# Patient Record
Sex: Female | Born: 1975 | Race: Black or African American | Hispanic: No | Marital: Married | Smoking: Current every day smoker
Health system: Southern US, Community
[De-identification: ages and names within clinical notes are randomized; demographics above are authoritative.]

## PROBLEM LIST (undated history)

## (undated) ENCOUNTER — Inpatient Hospital Stay (HOSPITAL_COMMUNITY): Payer: Self-pay

## (undated) ENCOUNTER — Ambulatory Visit (HOSPITAL_COMMUNITY): Discharge status: Absent without leave | Payer: No Typology Code available for payment source

## (undated) DIAGNOSIS — Z789 Other specified health status: Secondary | ICD-10-CM

## (undated) DIAGNOSIS — B1910 Unspecified viral hepatitis B without hepatic coma: Secondary | ICD-10-CM

## (undated) HISTORY — PX: WISDOM TOOTH EXTRACTION: SHX21

## (undated) HISTORY — PX: LAPAROSCOPY: SHX197

---

## 1998-08-01 ENCOUNTER — Emergency Department (HOSPITAL_COMMUNITY): Admission: EM | Admit: 1998-08-01 | Discharge: 1998-08-01 | Payer: Self-pay | Admitting: *Deleted

## 2000-11-25 ENCOUNTER — Encounter: Payer: Self-pay | Admitting: Emergency Medicine

## 2000-11-25 ENCOUNTER — Emergency Department (HOSPITAL_COMMUNITY): Admission: EM | Admit: 2000-11-25 | Discharge: 2000-11-25 | Payer: Self-pay | Admitting: Emergency Medicine

## 2001-01-17 ENCOUNTER — Emergency Department (HOSPITAL_COMMUNITY): Admission: EM | Admit: 2001-01-17 | Discharge: 2001-01-17 | Payer: Self-pay | Admitting: Emergency Medicine

## 2001-01-17 ENCOUNTER — Encounter: Payer: Self-pay | Admitting: Emergency Medicine

## 2001-05-20 ENCOUNTER — Emergency Department (HOSPITAL_COMMUNITY): Admission: EM | Admit: 2001-05-20 | Discharge: 2001-05-20 | Payer: Self-pay | Admitting: Emergency Medicine

## 2001-05-20 ENCOUNTER — Encounter: Payer: Self-pay | Admitting: Emergency Medicine

## 2001-09-15 ENCOUNTER — Emergency Department (HOSPITAL_COMMUNITY): Admission: EM | Admit: 2001-09-15 | Discharge: 2001-09-15 | Payer: Self-pay | Admitting: Emergency Medicine

## 2001-09-15 ENCOUNTER — Encounter: Payer: Self-pay | Admitting: Emergency Medicine

## 2002-10-10 ENCOUNTER — Encounter: Payer: Self-pay | Admitting: Emergency Medicine

## 2002-10-10 ENCOUNTER — Emergency Department (HOSPITAL_COMMUNITY): Admission: EM | Admit: 2002-10-10 | Discharge: 2002-10-10 | Payer: Self-pay | Admitting: Emergency Medicine

## 2002-12-05 ENCOUNTER — Encounter: Payer: Self-pay | Admitting: *Deleted

## 2002-12-06 ENCOUNTER — Inpatient Hospital Stay (HOSPITAL_COMMUNITY): Admission: EM | Admit: 2002-12-06 | Discharge: 2002-12-08 | Payer: Self-pay | Admitting: Emergency Medicine

## 2003-02-02 ENCOUNTER — Encounter: Admission: RE | Admit: 2003-02-02 | Discharge: 2003-02-02 | Payer: Self-pay | Admitting: Internal Medicine

## 2003-08-07 ENCOUNTER — Inpatient Hospital Stay (HOSPITAL_COMMUNITY): Admission: AD | Admit: 2003-08-07 | Discharge: 2003-08-07 | Payer: Self-pay | Admitting: *Deleted

## 2003-08-15 ENCOUNTER — Emergency Department (HOSPITAL_COMMUNITY): Admission: EM | Admit: 2003-08-15 | Discharge: 2003-08-15 | Payer: Self-pay

## 2003-08-27 ENCOUNTER — Inpatient Hospital Stay (HOSPITAL_COMMUNITY): Admission: AD | Admit: 2003-08-27 | Discharge: 2003-08-27 | Payer: Self-pay | Admitting: Gynecology

## 2004-01-06 ENCOUNTER — Ambulatory Visit: Payer: Self-pay | Admitting: Family Medicine

## 2004-01-06 ENCOUNTER — Inpatient Hospital Stay (HOSPITAL_COMMUNITY): Admission: AD | Admit: 2004-01-06 | Discharge: 2004-01-06 | Payer: Self-pay | Admitting: *Deleted

## 2004-01-09 ENCOUNTER — Inpatient Hospital Stay (HOSPITAL_COMMUNITY): Admission: AD | Admit: 2004-01-09 | Discharge: 2004-01-09 | Payer: Self-pay | Admitting: Obstetrics and Gynecology

## 2004-02-01 ENCOUNTER — Inpatient Hospital Stay (HOSPITAL_COMMUNITY): Admission: AD | Admit: 2004-02-01 | Discharge: 2004-02-01 | Payer: Self-pay | Admitting: Obstetrics & Gynecology

## 2004-02-08 ENCOUNTER — Ambulatory Visit (HOSPITAL_COMMUNITY): Admission: RE | Admit: 2004-02-08 | Discharge: 2004-02-08 | Payer: Self-pay | Admitting: *Deleted

## 2004-04-01 ENCOUNTER — Inpatient Hospital Stay: Admission: AD | Admit: 2004-04-01 | Discharge: 2004-04-01 | Payer: Self-pay | Admitting: *Deleted

## 2004-04-03 ENCOUNTER — Inpatient Hospital Stay (HOSPITAL_COMMUNITY): Admission: AD | Admit: 2004-04-03 | Discharge: 2004-04-03 | Payer: Self-pay | Admitting: *Deleted

## 2004-04-16 ENCOUNTER — Inpatient Hospital Stay (HOSPITAL_COMMUNITY): Admission: AD | Admit: 2004-04-16 | Discharge: 2004-04-19 | Payer: Self-pay | Admitting: Family Medicine

## 2004-04-16 ENCOUNTER — Ambulatory Visit: Payer: Self-pay | Admitting: Family Medicine

## 2008-07-15 ENCOUNTER — Emergency Department (HOSPITAL_COMMUNITY): Admission: EM | Admit: 2008-07-15 | Discharge: 2008-07-15 | Payer: Self-pay | Admitting: Emergency Medicine

## 2010-05-30 NOTE — H&P (Signed)
NAME:  Alice Hernandez, Alice Hernandez NO.:  [id number]   MEDICAL RECORD NO.:  [id number]                   PATIENT TYPE:  INP   LOCATION:  0106                                 FACILITY:  [facility]   PHYSICIAN:  Hollice Espy, M.D.            DATE OF BIRTH:  1975-07-28   DATE OF ADMISSION:  12/06/2002  DATE OF DISCHARGE:                                HISTORY & PHYSICAL   HISTORY OF PRESENT ILLNESS:  This is a 35 year old African-American female  with past medical history of a laparoscopic ovarian cystectomy, who for the  past five to seven days has had increased abdominal pain, mostly located in  the right lower quadrant radiating to the right flank.  She has had  secondary nausea, fevers, and chills.  She denies any hematuria or dysuria.  The pain became so severe that she went to [facility] on December 05, 2002, evening.  There she had a UA which showed moderate bacteria, a few  clue cells, but no white cells in the urine were seen.  She also had a white  count of 14.1 with a 93% shift.  Her urine pregnancy was negative.  She was  also noted to have urinary protein.  She ended up having a gynecologic  ultrasound which was normal, showing normal uterus and ovaries and no free  fluid.  On the report it was noted that she had scant thick white vaginal  discharge that had a positive odor.  There was concern that a definitive  diagnosis was not yet made and she may have appendicitis, so she was  transferred to [facility] for a CT.  The CT showed no evidence of  appendicitis but did show perinephric stranding on her right kidney  consistent with pyelonephritis.  Surgery, Adolph Pollack, M.D., was  consulted to correlate this, and he indeed felt that this was not  appendicitis and this was likely pyelonephritis.  The patient was given  morphine for pain, which did help, but she remains severely tender.  Her  last menstrual period was six days ago.  Currently  she is sleeping and says  that she is not having any nausea or fevers but she did complain of some  mild pain still.  Otherwise, she does not give much of a history as she is  very tired.   PAST MEDICAL HISTORY:  Laparoscopic ovarian cystectomy.   MEDICATIONS:  She is not on any chronic medications.  She tried taking a  Vicodin for pain prior to coming in on the 23rd, but this did not help.  She  is also on birth control pills.   ALLERGIES:  She has no known drug allergies.   SOCIAL HISTORY:  She is a half pack a day smoker for the last few years.  She does drink alcohol occasionally.  She denies any drug use.   FAMILY HISTORY:  Noncontributory.  PHYSICAL EXAMINATION:  VITAL SIGNS:  Her vitals on admission were  temperature 102.8, pulse 78, respirations 24, blood pressure 130/69.  GENERAL:  She is currently in no apparent distress prior to examination.  She is lethargic but arousable.  She is oriented x3.  HEENT:  Normocephalic, atraumatic.  Her mucous membranes are dry.  NECK:  No carotid bruits.  CARDIAC:  Regular rate and rhythm.  CHEST:  Lungs clear to auscultation bilaterally.  ABDOMEN:  Extremely tender throughout, especially in the right lower  quadrant but also in the right upper quadrant as well, with minimal  palpation, and light contact causes severe pain.  She has hypoactive bowel  sounds.  It is nondistended.  Her right flank is even more exquisitely  tender.  Her left flank is not as tender; however, causing secondary  radiation does make it tender.  EXTREMITIES:  No clubbing, cyanosis, or edema.   LABORATORY DATA:  Lab studies are all from [facility].  Urine showed  few clue cells, no white cells, no yeast, and no Trichomonas.  Urine  pregnancy was negative.  White count 14.1, H&H 11.7 and 34.0, MCV of 89,  platelet count 149.  Three to six red cells seen in the UA, few bacteria on  the second report.  Moderate amount of hemoglobin, 100 protein, 15  ketones.  Ultrasound again showed no evidence of any abnormalities.   ASSESSMENT AND PLAN:  This is a 35 year old African-American female with  what looks to be pyelonephritis despite a nonimpressive urinalysis but with  CT and physical exam findings.  Would give her pain and nausea control, IV  Cipro, check blood cultures.  Should consider a renal ultrasound or a  cystoscopy if she is not improving.  Note that she has severe abdominal pain  which is very tender, which again may be radiation from her flank, but we  will still follow up on the final read of the CT.  Will check LFTs and  lipase for thoroughness.  May consider Flagyl for Trichomonas if she is not  getting any better.  She also had reported white vaginal discharge.  Will  give a dose of Diflucan for this.                                               Hollice Espy, M.D.    SKK/MEDQ  D:  12/06/2002  T:  12/06/2002  Job:  [id number]

## 2010-05-30 NOTE — H&P (Signed)
NAMEPATRA, Hernandez NO.:  [id number]   MEDICAL RECORD NO.:  [id number]                   PATIENT TYPE:  INP   LOCATION:  0106                                 FACILITY:  [facility]   PHYSICIAN:  Adolph Pollack, M.D.            DATE OF BIRTH:  [birth year]   DATE OF ADMISSION:  12/06/2002  DATE OF DISCHARGE:                                HISTORY & PHYSICAL   REQUESTING PHYSICIAN:  Carleene Cooper, M.D.   REASON FOR ADMISSION:  Right lower quadrant, possible appendicitis.   HISTORY OF PRESENT ILLNESS:  Ms. Alice Hernandez is a 35 year old female who had the  onset of right-sided abdominal pain that felt like a cramping about a week  ago.  It progressively worsened and began radiating to the right lower  quadrant and right flank region.  She had some anorexia and hard fevers and  chills.  No vomiting present.  She denied any dysuria or vaginal discharge.  Also reported having a little bit of diarrhea but the last BM was about four  to five days ago.  She has not eaten much or had much to drink.   PAST MEDICAL HISTORY:  Ovarian cyst.   PAST SURGICAL HISTORY:  Laparoscopic ovarian cystectomy.   ALLERGIES:  None.   MEDICATIONS:  1. Tylenol p.r.n.  2. BC powders p.r.n.  3. Birth control pill.   SOCIAL HISTORY:  Smokes a half pack of cigarettes a day.  Occasional  alcohol.  She is single and employed.   FAMILY HISTORY:  No chronic illnesses in family.   REVIEW OF SYSTEMS:  CARDIOVASCULAR:  No hypertension, coronary artery  disease.  PULMONARY:  No asthma, pneumonia.  GASTROINTESTINAL:  No history  of peptic ulcer disease, hepatitis, colitis, or diverticulitis.  GENITOURINARY:  Denies any kidney stones.  ENDOCRINE:  No diabetes or  thyroid disease.  NEUROLOGIC:  No seizures.  HEMATOLOGIC:  No bleeding  disorders, deep venous thrombosis.   PHYSICAL EXAMINATION:  GENERAL:  Ill-appearing female.  VITAL SIGNS:  Maximum temperature in the emergency department  103.8 with  blood pressure 135/83 and a pulse of 111.  HEENT:  Eyes:  Extraocular movements are intact.  No icterus.  NECK:  Without palpable masses or obvious thyroid enlargement.  RESPIRATORY:  Breath sounds equal and clear.  Respirations unlabored.  CARDIOVASCULAR:  Increased rate with a regular rhythm.  No JVD and no lower  extremity edema.  ABDOMEN:  Slightly firm, distended with hypoactive bowel sounds.  She has  right lower quadrant and right upper quadrant tenderness to palpation and  some to percussion.  She has severe right flank tenderness to palpation.  No  palpable masses or organomegaly noted.  EXTREMITIES:  Good muscle tone and full range of motion.   LABORATORY DATA:  White blood cell count 14,100, platelet count 149,000,  hemoglobin 11.  UA showed 3-6 white blood cells, some rare bacteria.   CT  scan demonstrates right kidney edema and perinephric inflammatory  changes.  Appendix is normal.   IMPRESSION:  Acute right pyelonephritis.  Appendix appears normal at this  time.  No significant free fluid noted.   RECOMMENDATIONS:  I recommend medical admission with intravenous fluid  hydration, intravenous antibiotics.                                               Adolph Pollack, M.D.    Kari Baars  D:  12/06/2002  T:  12/06/2002  Job:  [id number]

## 2010-05-30 NOTE — Discharge Summary (Signed)
NAMEBETSEY, SOSSAMON                           ACCOUNT NO.:  [id number]   MEDICAL RECORD NO.:  [id number]                   PATIENT TYPE:  INP   LOCATION:  0355                                 FACILITY:  [facility]   PHYSICIAN:  Corinna L. Lendell Caprice, MD             DATE OF BIRTH:  [birth year]   DATE OF ADMISSION:  12/06/2002  DATE OF DISCHARGE:  12/08/2002                                 DISCHARGE SUMMARY   DIAGNOSES:  1. Pyelonephritis.  2. Bacterial vaginitis.  3. Candidal vaginitis.  4. Hypokalemia.  5. Tobacco abuse, counseled against.   DISCHARGE MEDICATIONS:  1. Ciprofloxacin 500 mg p.o. b.i.d. for four more days.  2. Flagyl 500 mg p.o. b.i.d. for seven days.  3. Phenergan as needed.  4. Percocet as needed; 15 were dispensed.   DIET:  As tolerated.  No alcohol while taking Flagyl.   FOLLOW UP:  Dr. Dorothyann Peng in 2-4 weeks.   ACTIVITY:  Ad lib.   CONDITION AT DISCHARGE:  Stable.   HISTORY AND HOSPITAL COURSE:  Very briefly, Ms. Cuthrell is a 35 year old  black female that presented to the [facility] with flank pain.  She  has a history of laparoscopic ovarian cystectomy.  She had right lower  quadrant tenderness and right flank tenderness and apparently had a lot of  guarding and there were concerns over appendicitis.  Therefore, she was  transferred to [facility] and seen by Dr. Abbey Chatters.  A CAT scan  also was done which showed no appendicitis but a lot of right-sided  perinephric stranding consistent with pyelonephritis.  Dr. Maris Berger  clinical exam also was against the possibility of appendicitis.  The patient  had blood cultures which are at this time pending.  She had a UA which was  not terribly remarkable but did show clue cells and she apparently had a lot  of white vaginal discharge.  She received IV ciprofloxacin, IV fluids, pain  medications, antiemetics, and antipyretics.  She also received a dose of  Diflucan.  Her fever improved, as did  her pain and vomiting.  At the time of  discharge, she was very anxious to go home and was ambulating outside.  She  should follow up with Dr. Allyne Gee in 2-4 weeks.                                               Corinna L. Lendell Caprice, MD    CLS/MEDQ  D:  12/08/2002  T:  12/08/2002  Job:  [id number]   cc:   Candyce Churn. Allyne Gee, M.D.  [address]  [address]  [place]  [place] [address]  Fax: [phone]

## 2011-01-13 NOTE — L&D Delivery Note (Signed)
Delivery Note  Alice Hernandez is a 35yo G4 now P2 at 37 weeks and 3 days (by date of conception, unconfirmed) who came in with [facility].  She had very late prenatal care (moved here from [facility]) and insufficient care even before moving.  She had two UDS that were positive for illegal substances, one for cocaine and the other for marijuana and opiates.  At 2:38 AM a viable female was delivered via Vaginal, Spontaneous Delivery (Presentation: Right Occiput Anterior).  APGAR: 9, 9; weight 5 lb 9.4 oz (2535 g) - he is AGA .   Placenta status: Intact, Spontaneous.  Cord: 3 vessels.  Anesthesia: Epidural  Episiotomy: None Lacerations: 1st degree Est. Blood Loss (mL): 350  Mom to postpartum.  Baby to nursery-stable.  Baby is a little jittery and will have a meconium drug screen.  Alice Hernandez, CNM, was present for the delivery.  Junious Silk S 07/22/2011, 4:01 AM  Pt had initially signed BTL papers, but has changed her mind and wants a Nexplanon now.  We pulled the epidural catheter after delivery in light of that.

## 2011-01-13 NOTE — L&D Delivery Note (Signed)
Attestation of Attending Supervision of Resident: Evaluation and management procedures were performed by the [facility] Medicine Resident under my supervision.  I have seen and examined the patient, reviewed the resident's note and chart, and I agree with the management and plan.  Anibal Henderson, M.D. 08/01/2011 2:57 PM

## 2011-07-05 ENCOUNTER — Encounter (HOSPITAL_COMMUNITY): Payer: Self-pay | Admitting: *Deleted

## 2011-07-05 ENCOUNTER — Inpatient Hospital Stay (HOSPITAL_COMMUNITY)
Admission: AD | Admit: 2011-07-05 | Discharge: 2011-07-06 | Disposition: A | Discharge status: Hospice | Payer: Medicaid Other | Source: Ambulatory Visit | Attending: Obstetrics & Gynecology | Admitting: Obstetrics & Gynecology

## 2011-07-05 DIAGNOSIS — R109 Unspecified abdominal pain: Secondary | ICD-10-CM | POA: Insufficient documentation

## 2011-07-05 DIAGNOSIS — A499 Bacterial infection, unspecified: Secondary | ICD-10-CM | POA: Insufficient documentation

## 2011-07-05 DIAGNOSIS — N760 Acute vaginitis: Secondary | ICD-10-CM | POA: Insufficient documentation

## 2011-07-05 DIAGNOSIS — O4700 False labor before 37 completed weeks of gestation, unspecified trimester: Secondary | ICD-10-CM | POA: Insufficient documentation

## 2011-07-05 DIAGNOSIS — B9689 Other specified bacterial agents as the cause of diseases classified elsewhere: Secondary | ICD-10-CM | POA: Insufficient documentation

## 2011-07-05 DIAGNOSIS — O2390 Unspecified genitourinary tract infection in pregnancy, unspecified trimester: Secondary | ICD-10-CM | POA: Insufficient documentation

## 2011-07-05 HISTORY — DX: Other specified health status: Z78.9

## 2011-07-05 LAB — DIFFERENTIAL
Basophils Absolute: 0 10*3/uL (ref 0.0–0.1)
Basophils Relative: 1 % (ref 0–1)
Eosinophils Absolute: 0.2 10*3/uL (ref 0.0–0.7)
Eosinophils Relative: 2 % (ref 0–5)
Lymphocytes Relative: 34 % (ref 12–46)

## 2011-07-05 LAB — OB RESULTS CONSOLE GBS: GBS: NEGATIVE

## 2011-07-05 LAB — URINALYSIS, ROUTINE W REFLEX MICROSCOPIC
Ketones, ur: NEGATIVE mg/dL
Nitrite: NEGATIVE
Specific Gravity, Urine: <1.005 — ABNORMAL LOW (ref 1.005–1.030)
pH: 7 (ref 5.0–8.0)

## 2011-07-05 LAB — CBC
MCV: 86 fL (ref 78.0–100.0)
Platelets: 195 10*3/uL (ref 150–400)
RDW: 14 % (ref 11.5–15.5)
WBC: 8.1 10*3/uL (ref 4.0–10.5)

## 2011-07-05 LAB — OB RESULTS CONSOLE GC/CHLAMYDIA
Chlamydia: NEGATIVE
Gonorrhea: NEGATIVE

## 2011-07-05 LAB — URINE MICROSCOPIC-ADD ON

## 2011-07-05 NOTE — MAU Note (Signed)
Stated earlier that she has been homeless and has not been having regular meals; states now that she just moved in with a girlfriend today; does not have custody of her child;  Upset, crying and seems slightly irrational; states that she "doesn't know anything about this baby and has nothing for this baby"; no prenatal care' has lived in [place] for past 4 months; admits to smoking tobacco and marijuana; denies any other drug use; pt's family lives in [place] and pt states that she does not want to go back to [place]; asking for an ultrasound "to make sure the baby is okay";

## 2011-07-05 NOTE — MAU Note (Signed)
Brought in by EMS; c/o ucs for past hour; c/o being homeless and having no prenatal care;

## 2011-07-06 DIAGNOSIS — N760 Acute vaginitis: Secondary | ICD-10-CM

## 2011-07-06 DIAGNOSIS — A499 Bacterial infection, unspecified: Secondary | ICD-10-CM

## 2011-07-06 LAB — WET PREP, GENITAL: Yeast Wet Prep HPF POC: NONE SEEN

## 2011-07-06 LAB — RAPID URINE DRUG SCREEN, HOSP PERFORMED
Barbiturates: NOT DETECTED
Benzodiazepines: NOT DETECTED

## 2011-07-06 MED ORDER — PRENATAL 27-0.8 MG PO TABS: 1.0000 | ORAL_TABLET | Freq: Every day | ORAL | Status: DC

## 2011-07-06 MED ORDER — METRONIDAZOLE 500 MG PO TABS: 500.0000 mg | ORAL_TABLET | Freq: Two times a day (BID) | ORAL | Status: AC

## 2011-07-06 NOTE — Discharge Instructions (Signed)
Bacterial Vaginosis Bacterial vaginosis (BV) is a vaginal infection where the normal balance of bacteria in the vagina is disrupted. The normal balance is then replaced by an overgrowth of certain bacteria. There are several different kinds of bacteria that can cause BV. BV is the most common vaginal infection in women of childbearing age. CAUSES   The cause of BV is not fully understood. BV develops when there is an increase or imbalance of harmful bacteria.   Some activities or behaviors can upset the normal balance of bacteria in the vagina and put women at increased risk including:   Having a new sex partner or multiple sex partners.   Douching.   Using an intrauterine device (IUD) for contraception.   It is not clear what role sexual activity plays in the development of BV. However, women that have never had sexual intercourse are rarely infected with BV.  Women do not get BV from toilet seats, bedding, swimming pools or from touching objects around them.  SYMPTOMS   Grey vaginal discharge.   A fish-like odor with discharge, especially after sexual intercourse.   Itching or burning of the vagina and vulva.   Burning or pain with urination.   Some women have no signs or symptoms at all.  DIAGNOSIS  Your caregiver must examine the vagina for signs of BV. Your caregiver will perform lab tests and look at the sample of vaginal fluid through a microscope. They will look for bacteria and abnormal cells (clue cells), a pH test higher than 4.5, and a positive amine test all associated with BV.  RISKS AND COMPLICATIONS   Pelvic inflammatory disease (PID).   Infections following gynecology surgery.   Developing HIV.   Developing herpes virus.  TREATMENT  Sometimes BV will clear up without treatment. However, all women with symptoms of BV should be treated to avoid complications, especially if gynecology surgery is planned. Female partners generally do not need to be treated.  However, BV may spread between female sex partners so treatment is helpful in preventing a recurrence of BV.   BV may be treated with antibiotics. The antibiotics come in either pill or vaginal cream forms. Either can be used with nonpregnant or pregnant women, but the recommended dosages differ. These antibiotics are not harmful to the baby.   BV can recur after treatment. If this happens, a second round of antibiotics will often be prescribed.   Treatment is important for pregnant women. If not treated, BV can cause a premature delivery, especially for a pregnant woman who had a premature birth in the past. All pregnant women who have symptoms of BV should be checked and treated.   For chronic reoccurrence of BV, treatment with a type of prescribed gel vaginally twice a week is helpful.  HOME CARE INSTRUCTIONS   Finish all medication as directed by your caregiver.   Do not have sex until treatment is completed.   Tell your sexual partner that you have a vaginal infection. They should see their caregiver and be treated if they have problems, such as a mild rash or itching.   Practice safe sex. Use condoms. Only have 1 sex partner.  PREVENTION  Basic prevention steps can help reduce the risk of upsetting the natural balance of bacteria in the vagina and developing BV:  Do not have sexual intercourse (be abstinent).   Do not douche.   Use all of the medicine prescribed for treatment of BV, even if the signs and symptoms go away.  Tell your sex partner if you have BV. That way, they can be treated, if needed, to prevent reoccurrence.  SEEK MEDICAL CARE IF:   Your symptoms are not improving after 3 days of treatment.   You have increased discharge, pain, or fever.  MAKE SURE YOU:   Understand these instructions.   Will watch your condition.   Will get help right away if you are not doing well or get worse.  FOR MORE INFORMATION  Division of STD Prevention (DSTDP), Centers for  Disease Control and Prevention: [employer] American Social Health Association (ASHA): www.ashastd.org  Document Released: 12/29/2004 Document Revised: 12/18/2010 Document Reviewed: 06/21/2008 [facility] Patient Information 2012 [place], [place].Bacterial Vaginosis Bacterial vaginosis (BV) is a vaginal infection where the normal balance of bacteria in the vagina is disrupted. The normal balance is then replaced by an overgrowth of certain bacteria. There are several different kinds of bacteria that can cause BV. BV is the most common vaginal infection in women of childbearing age. CAUSES   The cause of BV is not fully understood. BV develops when there is an increase or imbalance of harmful bacteria.   Some activities or behaviors can upset the normal balance of bacteria in the vagina and put women at increased risk including:   Having a new sex partner or multiple sex partners.   Douching.   Using an intrauterine device (IUD) for contraception.   It is not clear what role sexual activity plays in the development of BV. However, women that have never had sexual intercourse are rarely infected with BV.  Women do not get BV from toilet seats, bedding, swimming pools or from touching objects around them.  SYMPTOMS   Grey vaginal discharge.   A fish-like odor with discharge, especially after sexual intercourse.   Itching or burning of the vagina and vulva.   Burning or pain with urination.   Some women have no signs or symptoms at all.  DIAGNOSIS  Your caregiver must examine the vagina for signs of BV. Your caregiver will perform lab tests and look at the sample of vaginal fluid through a microscope. They will look for bacteria and abnormal cells (clue cells), a pH test higher than 4.5, and a positive amine test all associated with BV.  RISKS AND COMPLICATIONS   Pelvic inflammatory disease (PID).   Infections following gynecology surgery.   Developing HIV.   Developing herpes  virus.  TREATMENT  Sometimes BV will clear up without treatment. However, all women with symptoms of BV should be treated to avoid complications, especially if gynecology surgery is planned. Female partners generally do not need to be treated. However, BV may spread between female sex partners so treatment is helpful in preventing a recurrence of BV.   BV may be treated with antibiotics. The antibiotics come in either pill or vaginal cream forms. Either can be used with nonpregnant or pregnant women, but the recommended dosages differ. These antibiotics are not harmful to the baby.   BV can recur after treatment. If this happens, a second round of antibiotics will often be prescribed.   Treatment is important for pregnant women. If not treated, BV can cause a premature delivery, especially for a pregnant woman who had a premature birth in the past. All pregnant women who have symptoms of BV should be checked and treated.   For chronic reoccurrence of BV, treatment with a type of prescribed gel vaginally twice a week is helpful.  HOME CARE INSTRUCTIONS   Finish all medication  as directed by your caregiver.   Do not have sex until treatment is completed.   Tell your sexual partner that you have a vaginal infection. They should see their caregiver and be treated if they have problems, such as a mild rash or itching.   Practice safe sex. Use condoms. Only have 1 sex partner.  PREVENTION  Basic prevention steps can help reduce the risk of upsetting the natural balance of bacteria in the vagina and developing BV:  Do not have sexual intercourse (be abstinent).   Do not douche.   Use all of the medicine prescribed for treatment of BV, even if the signs and symptoms go away.   Tell your sex partner if you have BV. That way, they can be treated, if needed, to prevent reoccurrence.  SEEK MEDICAL CARE IF:   Your symptoms are not improving after 3 days of treatment.   You have increased  discharge, pain, or fever.  MAKE SURE YOU:   Understand these instructions.   Will watch your condition.   Will get help right away if you are not doing well or get worse.  FOR MORE INFORMATION  Division of STD Prevention (DSTDP), Centers for Disease Control and Prevention: [employer]Apps[employer] American Social Health Association (ASHA): www.ashastd.org  Document Released: 12/29/2004 Document Revised: 12/18/2010 Document Reviewed: 06/21/2008 [facility] Patient Information 2012 [place], [place].  Braxton Hicks Contractions Pregnancy is commonly associated with contractions of the uterus throughout the pregnancy. Towards the end of pregnancy (32 to 34 weeks), these contractions [facility] Rough) can develop more often and may become more forceful. This is not true labor because these contractions do not result in opening (dilatation) and thinning of the cervix. They are sometimes difficult to tell apart from true labor because these contractions can be forceful and people have different pain tolerances. You should not feel embarrassed if you go to the hospital with false labor. Sometimes, the only way to tell if you are in true labor is for your caregiver to follow the changes in the cervix. How to tell the difference between true and false labor:  False labor.   The contractions of false labor are usually shorter, irregular and not as hard as those of true labor.   They are often felt in the front of the lower abdomen and in the groin.   They may leave with walking around or changing positions while lying down.   They get weaker and are shorter lasting as time goes on.   These contractions are usually irregular.   They do not usually become progressively stronger, regular and closer together as with true labor.   True labor.   Contractions in true labor last 30 to 70 seconds, become very regular, usually become more intense, and increase in frequency.   They do not go away with walking.    The discomfort is usually felt in the top of the uterus and spreads to the lower abdomen and low back.   True labor can be determined by your caregiver with an exam. This will show that the cervix is dilating and getting thinner.  If there are no prenatal problems or other health problems associated with the pregnancy, it is completely safe to be sent home with false labor and await the onset of true labor. HOME CARE INSTRUCTIONS   Keep up with your usual exercises and instructions.   Take medications as directed.   Keep your regular prenatal appointment.   Eat and drink lightly if you think  you are going into labor.   If BH contractions are making you uncomfortable:   Change your activity position from lying down or resting to walking/walking to resting.   Sit and rest in a tub of warm water.   Drink 2 to 3 glasses of water. Dehydration may cause B-H contractions.   Do slow and deep breathing several times an hour.  SEEK IMMEDIATE MEDICAL CARE IF:   Your contractions continue to become stronger, more regular, and closer together.   You have a gushing, burst or leaking of fluid from the vagina.   An oral temperature above 102 F (38.9 C) develops.   You have passage of blood-tinged mucus.   You develop vaginal bleeding.   You develop continuous belly (abdominal) pain.   You have low back pain that you never had before.   You feel the baby's head pushing down causing pelvic pressure.   The baby is not moving as much as it used to.  Document Released: 12/29/2004 Document Revised: 12/18/2010 Document Reviewed: 06/22/2008 [facility] Patient Information 2012 [place], [place].  ABCs of Pregnancy A Antepartum care is very important. Be sure you see your doctor and get prenatal care as soon as you think you are pregnant. At this time, you will be tested for infection, genetic abnormalities and potential problems with you and the pregnancy. This is the time to discuss diet,  exercise, work, medications, labor, pain medication during labor and the possibility of a cesarean delivery. Ask any questions that may concern you. It is important to see your doctor regularly throughout your pregnancy. Avoid exposure to toxic substances and chemicals - such as cleaning solvents, lead and mercury, some insecticides, and paint. Pregnant women should avoid exposure to paint fumes, and fumes that cause you to feel ill, dizzy or faint. When possible, it is a good idea to have a pre-pregnancy consultation with your caregiver to begin some important recommendations your caregiver suggests such as, taking folic acid, exercising, quitting smoking, avoiding alcoholic beverages, etc. B Breastfeeding is the healthiest choice for both you and your baby. It has many nutritional benefits for the baby and health benefits for the mother. It also creates a very tight and loving bond between the baby and mother. Talk to your doctor, your family and friends, and your employer about how you choose to feed your baby and how they can support you in your decision. Not all birth defects can be prevented, but a woman can take actions that may increase her chance of having a healthy baby. Many birth defects happen very early in pregnancy, sometimes before a woman even knows she is pregnant. Birth defects or abnormalities of any child in your or the father's family should be discussed with your caregiver. Get a good support bra as your breast size changes. Wear it especially when you exercise and when nursing.  C Celebrate the news of your pregnancy with the your spouse/father and family. Childbirth classes are helpful to take for you and the spouse/father because it helps to understand what happens during the pregnancy, labor and delivery. Cesarean delivery should be discussed with your doctor so you are prepared for that possibility. The pros and cons of circumcision if it is a boy, should be discussed with your  pediatrician. Cigarette smoking during pregnancy can result in low birth weight babies. It has been associated with infertility, miscarriages, tubal pregnancies, infant death (mortality) and poor health (morbidity) in childhood. Additionally, cigarette smoking may cause long-term learning disabilities. If  you smoke, you should try to quit before getting pregnant and not smoke during the pregnancy. Secondary smoke may also harm a mother and her developing baby. It is a good idea to ask people to stop smoking around you during your pregnancy and after the baby is born. Extra calcium is necessary when you are pregnant and is found in your prenatal vitamin, in dairy products, green leafy vegetables and in calcium supplements. D A healthy diet according to your current weight and height, along with vitamins and mineral supplements should be discussed with your caregiver. Domestic abuse or violence should be made known to your doctor right away to get the situation corrected. Drink more water when you exercise to keep hydrated. Discomfort of your back and legs usually develops and progresses from the middle of the second trimester through to delivery of the baby. This is because of the enlarging baby and uterus, which may also affect your balance. Do not take illegal drugs. Illegal drugs can seriously harm the baby and you. Drink extra fluids (water is best) throughout pregnancy to help your body keep up with the increases in your blood volume. Drink at least 6 to 8 glasses of water, fruit juice, or milk each day. A good way to know you are drinking enough fluid is when your urine looks almost like clear water or is very light yellow.  E Eat healthy to get the nutrients you and your unborn baby need. Your meals should include the five basic food groups. Exercise (30 minutes of light to moderate exercise a day) is important and encouraged during pregnancy, if there are no medical problems or problems with the  pregnancy. Exercise that causes discomfort or dizziness should be stopped and reported to your caregiver. Emotions during pregnancy can change from being ecstatic to depression and should be understood by you, your partner and your family. F Fetal screening with ultrasound, amniocentesis and monitoring during pregnancy and labor is common and sometimes necessary. Take 400 micrograms of folic acid daily both before, when possible, and during the first few months of pregnancy to reduce the risk of birth defects of the brain and spine. All women who could possibly become pregnant should take a vitamin with folic acid, every day. It is also important to eat a healthy diet with fortified foods (enriched grain products, including cereals, rice, breads, and pastas) and foods with natural sources of folate (orange juice, green leafy vegetables, beans, peanuts, broccoli, asparagus, peas, and lentils). The father should be involved with all aspects of the pregnancy including, the prenatal care, childbirth classes, labor, delivery, and postpartum time. Fathers may also have emotional concerns about being a father, financial needs, and raising a family. G Genetic testing should be done appropriately. It is important to know your family and the father's history. If there have been problems with pregnancies or birth defects in your family, report these to your doctor. Also, genetic counselors can talk with you about the information you might need in making decisions about having a family. You can call a major medical center in your area for help in finding a board-certified genetic counselor. Genetic testing and counseling should be done before pregnancy when possible, especially if there is a history of problems in the mother's or father's family. Certain ethnic backgrounds are more at risk for genetic defects. H Get familiar with the hospital where you will be having your baby. Get to know how long it takes to get there,  the labor and  delivery area, and the hospital procedures. Be sure your medical insurance is accepted there. Get your home ready for the baby including, clothes, the baby's room (when possible), furniture and car seat. Hand washing is important throughout the day, especially after handling raw meat and poultry, changing the baby's diaper or using the bathroom. This can help prevent the spread of many bacteria and viruses that cause infection. Your hair may become dry and thinner, but will return to normal a few weeks after the baby is born. Heartburn is a common problem that can be treated by taking antacids recommended by your caregiver, eating smaller meals 5 or 6 times a day, not drinking liquids when eating, drinking between meals and raising the head of your bed 2 to 3 inches. I Insurance to cover you, the baby, doctor and hospital should be reviewed so that you will be prepared to pay any costs not covered by your insurance plan. If you do not have medical insurance, there are usually clinics and services available for you in your community. Take 30 milligrams of iron during your pregnancy as prescribed by your doctor to reduce the risk of low red blood cells (anemia) later in pregnancy. All women of childbearing age should eat a diet rich in iron. J There should be a joint effort for the mother, father and any other children to adapt to the pregnancy financially, emotionally, and psychologically during the pregnancy. Join a support group for moms-to-be. Or, join a class on parenting or childbirth. Have the family participate when possible. K Know your limits. Let your caregiver know if you experience any of the following:   Pain of any kind.   Strong cramps.   You develop a lot of weight in a short period of time (5 pounds in 3 to 5 days).   Vaginal bleeding, leaking of amniotic fluid.   Headache, vision problems.   Dizziness, fainting, shortness of breath.   Chest pain.   Fever of 102 F  (38.9 C) or higher.   Gush of clear fluid from your vagina.   Painful urination.   Domestic violence.   Irregular heartbeat (palpitations).   Rapid beating of the heart (tachycardia).   Constant feeling sick to your stomach (nauseous) and vomiting.   Trouble walking, fluid retention (edema).   Muscle weakness.   If your baby has decreased activity.   Persistent diarrhea.   Abnormal vaginal discharge.   Uterine contractions at 20-minute intervals.   Back pain that travels down your leg.  L Learn and practice that what you eat and drink should be in moderation and healthy for you and your baby. Legal drugs such as alcohol and caffeine are important issues for pregnant women. There is no safe amount of alcohol a woman can drink while pregnant. Fetal alcohol syndrome, a disorder characterized by growth retardation, facial abnormalities, and central nervous system dysfunction, is caused by a woman's use of alcohol during pregnancy. Caffeine, found in tea, coffee, soft drinks and chocolate, should also be limited. Be sure to read labels when trying to cut down on caffeine during pregnancy. More than 200 foods, beverages, and over-the-counter medications contain caffeine and have a high salt content! There are coffees and teas that do not contain caffeine. M Medical conditions such as diabetes, epilepsy, and high blood pressure should be treated and kept under control before pregnancy when possible, but especially during pregnancy. Ask your caregiver about any medications that may need to be changed or adjusted during pregnancy.  If you are currently taking any medications, ask your caregiver if it is safe to take them while you are pregnant or before getting pregnant when possible. Also, be sure to discuss any herbs or vitamins you are taking. They are medicines, too! Discuss with your doctor all medications, prescribed and over-the-counter, that you are taking. During your prenatal visit,  discuss the medications your doctor may give you during labor and delivery. N Never be afraid to ask your doctor or caregiver questions about your health, the progress of the pregnancy, family problems, stressful situations, and recommendation for a pediatrician, if you do not have one. It is better to take all precautions and discuss any questions or concerns you may have during your office visits. It is a good idea to write down your questions before you visit the doctor. O Over-the-counter cough and cold remedies may contain alcohol or other ingredients that should be avoided during pregnancy. Ask your caregiver about prescription, herbs or over-the-counter medications that you are taking or may consider taking while pregnant.  P Physical activity during pregnancy can benefit both you and your baby by lessening discomfort and fatigue, providing a sense of well-being, and increasing the likelihood of early recovery after delivery. Light to moderate exercise during pregnancy strengthens the belly (abdominal) and back muscles. This helps improve posture. Practicing yoga, walking, swimming, and cycling on a stationary bicycle are usually safe exercises for pregnant women. Avoid scuba diving, exercise at high altitudes (over 3000 feet), skiing, horseback riding, contact sports, etc. Always check with your doctor before beginning any kind of exercise, especially during pregnancy and especially if you did not exercise before getting pregnant. Q Queasiness, stomach upset and morning sickness are common during pregnancy. Eating a couple of crackers or dry toast before getting out of bed. Foods that you normally love may make you feel sick to your stomach. You may need to substitute other nutritious foods. Eating 5 or 6 small meals a day instead of 3 large ones may make you feel better. Do not drink with your meals, drink between meals. Questions that you have should be written down and asked during your prenatal  visits. R Read about and make plans to baby-proof your home. There are important tips for making your home a safer environment for your baby. Review the tips and make your home safer for you and your baby. Read food labels regarding calories, salt and fat content in the food. S Saunas, hot tubs, and steam rooms should be avoided while you are pregnant. Excessive high heat may be harmful during your pregnancy. Your caregiver will screen and examine you for sexually transmitted diseases and genetic disorders during your prenatal visits. Learn the signs of labor. Sexual relations while pregnant is safe unless there is a medical or pregnancy problem and your caregiver advises against it. T Traveling long distances should be avoided especially in the third trimester of your pregnancy. If you do have to travel out of state, be sure to take a copy of your medical records and medical insurance plan with you. You should not travel long distances without seeing your doctor first. Most airlines will not allow you to travel after 36 weeks of pregnancy. Toxoplasmosis is an infection caused by a parasite that can seriously harm an unborn baby. Avoid eating undercooked meat and handling cat litter. Be sure to wear gloves when gardening. Tingling of the hands and fingers is not unusual and is due to fluid retention. This will go away  after the baby is born. U Womb (uterus) size increases during the first trimester. Your kidneys will begin to function more efficiently. This may cause you to feel the need to urinate more often. You may also leak urine when sneezing, coughing or laughing. This is due to the growing uterus pressing against your bladder, which lies directly in front of and slightly under the uterus during the first few months of pregnancy. If you experience burning along with frequency of urination or bloody urine, be sure to tell your doctor. The size of your uterus in the third trimester may cause a problem  with your balance. It is advisable to maintain good posture and avoid wearing high heels during this time. An ultrasound of your baby may be necessary during your pregnancy and is safe for you and your baby. V Vaccinations are an important concern for pregnant women. Get needed vaccines before pregnancy. Center for Disease Control ([employer]) has clear guidelines for the use of vaccines during pregnancy. Review the list, be sure to discuss it with your doctor. Prenatal vitamins are helpful and healthy for you and the baby. Do not take extra vitamins except what is recommended. Taking too much of certain vitamins can cause overdose problems. Continuous vomiting should be reported to your caregiver. Varicose veins may appear especially if there is a family history of varicose veins. They should subside after the delivery of the baby. Support hose helps if there is leg discomfort. W Being overweight or underweight during pregnancy may cause problems. Try to get within 15 pounds of your ideal weight before pregnancy. Remember, pregnancy is not a time to be dieting! Do not stop eating or start skipping meals as your weight increases. Both you and your baby need the calories and nutrition you receive from a healthy diet. Be sure to consult with your doctor about your diet. There is a formula and diet plan available depending on whether you are overweight or underweight. Your caregiver or nutritionist can help and advise you if necessary. X Avoid X-rays. If you must have dental work or diagnostic tests, tell your dentist or physician that you are pregnant so that extra care can be taken. X-rays should only be taken when the risks of not taking them outweigh the risk of taking them. If needed, only the minimum amount of radiation should be used. When X-rays are necessary, protective lead shields should be used to cover areas of the body that are not being X-rayed. Y Your baby loves you. Breastfeeding your baby  creates a loving and very close bond between the two of you. Give your baby a healthy environment to live in while you are pregnant. Infants and children require constant care and guidance. Their health and safety should be carefully watched at all times. After the baby is born, rest or take a nap when the baby is sleeping. Z Get your ZZZs. Be sure to get plenty of rest. Resting on your side as often as possible, especially on your left side is advised. It provides the best circulation to your baby and helps reduce swelling. Try taking a nap for 30 to 45 minutes in the afternoon when possible. After the baby is born rest or take a nap when the baby is sleeping. Try elevating your feet for that amount of time when possible. It helps the circulation in your legs and helps reduce swelling.  Most information courtesy of the CDC. Document Released: 12/29/2004 Document Revised: 12/18/2010 Document Reviewed: 09/12/2008 ExitCare Patient  Information 2012 [place], [place].

## 2011-07-06 NOTE — MAU Provider Note (Signed)
History     CSN: [id number] Arrival date and time: 07/05/11 2214  First Provider Initiated Contact with Patient 07/05/11 2349    Chief Complaint  Patient presents with  . Labor Eval   HPI Comments: Pt presents to MAU for evaluation of abdominal pain and cramping.  States she has received no prenatal care due to moving from [place] Plans to stay in [place].  Pt is currently homeless but is staying with a friend. Pt reports being very anxious, admits to [facility] use.   Feeling baby move appropriately,  States no bleeding,  Has had discharge/leaking of fluid for 2 weeks but no large gush of fluids.   Abdominal pain is bilateral and associated with cramping.     OB History    Grav Para Term Preterm Abortions TAB SAB Ect Mult Living   4 1 1  2 2    1       Past Medical History  Diagnosis Date  . No pertinent past medical history     Past Surgical History  Procedure Date  . Laparoscopy   . Wisdom tooth extraction     Family History  Problem Relation Age of Onset  . Other Neg Hx     History  Substance Use Topics  . Smoking status: Current Everyday Smoker -- 0.2 packs/day  . Smokeless tobacco: Not on file  . Alcohol Use: Yes    Allergies: Allergies not on file  No prescriptions prior to admission    Review of Systems  Constitutional: Negative for fever and chills.  HENT: Negative for neck pain.   Eyes: Negative for blurred vision and double vision.  Respiratory: Negative for cough, shortness of breath and wheezing.   Cardiovascular: Negative for chest pain, palpitations and leg swelling.  Gastrointestinal: Positive for abdominal pain. Negative for heartburn, nausea, vomiting, diarrhea, constipation and blood in stool.  Genitourinary: Negative for dysuria, urgency and frequency.  Musculoskeletal: Negative for myalgias and back pain.  Neurological: Negative for dizziness and headaches.  Psychiatric/Behavioral: Negative.    Physical Exam   Blood pressure 108/63, pulse 81,  temperature 97.9 F (36.6 C), temperature source Oral, resp. rate 22, height 5\' 4"  (1.626 m), weight 77.111 kg (170 lb).  Physical Exam  Constitutional: She is oriented to person, place, and time. She appears well-developed and well-nourished. No distress.  HENT:  Head: Normocephalic and atraumatic.  Eyes: Conjunctivae are normal. Right eye exhibits no discharge. Left eye exhibits no discharge. No scleral icterus.  Cardiovascular: Normal rate, regular rhythm and intact distal pulses.   Respiratory: Effort normal and breath sounds normal. No respiratory distress. She exhibits no tenderness.  GI: Soft. Bowel sounds are normal. She exhibits distension and mass. There is no tenderness. There is no rebound and no guarding.  Genitourinary: Vagina normal.  Musculoskeletal: Normal range of motion.  Neurological: She is alert and oriented to person, place, and time. She exhibits normal muscle tone.  Skin: Skin is warm and dry. No rash noted. She is not diaphoretic. No erythema. No pallor.  Psychiatric: Her behavior is normal. Judgment and thought content normal.       Anxious regarding current pregnancy    MAU Course  Procedures Results for orders placed during the hospital encounter of 07/05/11 (from the past 24 hour(s))  URINALYSIS, ROUTINE W REFLEX MICROSCOPIC     Status: Abnormal   Collection Time   07/05/11 11:21 PM      Component Value Range   Color, Urine YELLOW  YELLOW  APPearance CLEAR  CLEAR   Specific Gravity, Urine <1.005 (*) 1.005 - 1.030   pH 7.0  5.0 - 8.0   Glucose, UA NEGATIVE  NEGATIVE mg/dL   Hgb urine dipstick NEGATIVE  NEGATIVE   Bilirubin Urine NEGATIVE  NEGATIVE   Ketones, ur NEGATIVE  NEGATIVE mg/dL   Protein, ur NEGATIVE  NEGATIVE mg/dL   Urobilinogen, UA 0.2  0.0 - 1.0 mg/dL   Nitrite NEGATIVE  NEGATIVE   Leukocytes, UA LARGE (*) NEGATIVE  URINE MICROSCOPIC-ADD ON     Status: Abnormal   Collection Time   07/05/11 11:21 PM      Component Value Range   Squamous  Epithelial / LPF MANY (*) RARE   WBC, UA 7-10  <3 WBC/hpf   Bacteria, UA RARE  RARE   Urine-Other MUCOUS PRESENT    CBC     Status: Abnormal   Collection Time   07/05/11 11:31 PM      Component Value Range   WBC 8.1  4.0 - 10.5 K/uL   RBC 3.51 (*) 3.87 - 5.11 MIL/uL   Hemoglobin 10.2 (*) 12.0 - 15.0 g/dL   HCT 19.1 (*) 47.8 - 29.5 %   MCV 86.0  78.0 - 100.0 fL   MCH 29.1  26.0 - 34.0 pg   MCHC 33.8  30.0 - 36.0 g/dL   RDW 62.1  30.8 - 65.7 %   Platelets 195  150 - 400 K/uL  DIFFERENTIAL     Status: Normal   Collection Time   07/05/11 11:31 PM      Component Value Range   Neutrophils Relative 56  43 - 77 %   Neutro Abs 4.5  1.7 - 7.7 K/uL   Lymphocytes Relative 34  12 - 46 %   Lymphs Abs 2.8  0.7 - 4.0 K/uL   Monocytes Relative 8  3 - 12 %   Monocytes Absolute 0.6  0.1 - 1.0 K/uL   Eosinophils Relative 2  0 - 5 %   Eosinophils Absolute 0.2  0.0 - 0.7 K/uL   Basophils Relative 1  0 - 1 %   Basophils Absolute 0.0  0.0 - 0.1 K/uL  WET PREP, GENITAL     Status: Abnormal   Collection Time   07/05/11 11:55 PM      Component Value Range   Yeast Wet Prep HPF POC NONE SEEN  NONE SEEN   Trich, Wet Prep NONE SEEN  NONE SEEN   Clue Cells Wet Prep HPF POC FEW (*) NONE SEEN   WBC, Wet Prep HPF POC FEW (*) NONE SEEN   Fern Test - Negative   MDM Category I fetal tracing; no regular contractions Will collect Prenatal Labs No indication for emergent U/S Will refer to [facility] for further care BV Will send Urine for Culture given large leukocytes but many epithelials  Assessment and Plan  Follow up in [facility] for continued care and scheduled [place] Braxton Hicks Contractions BV will treat with Flagyl bid X 7 days  Andrena Mews, DO [facility] Resident - PGY-1 07/06/2011 12:24 AM

## 2011-07-07 LAB — GC/CHLAMYDIA PROBE AMP, URINE: GC Probe Amp, Urine: NEGATIVE

## 2011-07-08 LAB — RPR: RPR Ser Ql: NONREACTIVE

## 2011-07-08 LAB — CULTURE, BETA STREP (GROUP B ONLY)

## 2011-07-09 ENCOUNTER — Encounter: Payer: Self-pay | Admitting: Obstetrics & Gynecology

## 2011-07-09 DIAGNOSIS — F1410 Cocaine abuse, uncomplicated: Secondary | ICD-10-CM | POA: Insufficient documentation

## 2011-07-09 DIAGNOSIS — O99320 Drug use complicating pregnancy, unspecified trimester: Secondary | ICD-10-CM

## 2011-07-14 NOTE — MAU Provider Note (Signed)
Agree with above note.  LEGGETT,KELLY H. 07/14/2011 8:23 PM  

## 2011-07-20 ENCOUNTER — Other Ambulatory Visit: Payer: Self-pay | Admitting: Obstetrics and Gynecology

## 2011-07-20 ENCOUNTER — Ambulatory Visit (INDEPENDENT_AMBULATORY_CARE_PROVIDER_SITE_OTHER): Payer: Medicaid Other | Admitting: Obstetrics and Gynecology

## 2011-07-20 ENCOUNTER — Encounter: Payer: Self-pay | Admitting: Obstetrics and Gynecology

## 2011-07-20 VITALS — BP 116/67 | Temp 97.9°F | Wt 161.3 lb

## 2011-07-20 DIAGNOSIS — Z3009 Encounter for other general counseling and advice on contraception: Secondary | ICD-10-CM

## 2011-07-20 DIAGNOSIS — O99340 Other mental disorders complicating pregnancy, unspecified trimester: Secondary | ICD-10-CM

## 2011-07-20 DIAGNOSIS — IMO0002 Reserved for concepts with insufficient information to code with codable children: Secondary | ICD-10-CM

## 2011-07-20 DIAGNOSIS — F1410 Cocaine abuse, uncomplicated: Secondary | ICD-10-CM

## 2011-07-20 DIAGNOSIS — O0990 Supervision of high risk pregnancy, unspecified, unspecified trimester: Secondary | ICD-10-CM

## 2011-07-20 DIAGNOSIS — F1920 Other psychoactive substance dependence, uncomplicated: Secondary | ICD-10-CM

## 2011-07-20 LAB — POCT URINALYSIS DIP (DEVICE)
Nitrite: NEGATIVE
Protein, ur: 30 mg/dL — AB
pH: 6 (ref 5.0–8.0)

## 2011-07-20 NOTE — Progress Notes (Signed)
U/S scheduled July 23, 2011 at 1030 am.

## 2011-07-20 NOTE — Addendum Note (Signed)
Addended by: Faythe Casa on: 07/20/2011 01:10 PM   Modules accepted: Orders

## 2011-07-20 NOTE — Addendum Note (Signed)
Addended by: Doreen Salvage on: 07/20/2011 11:21 AM   Modules accepted: Orders

## 2011-07-20 NOTE — Progress Notes (Signed)
   Subjective:    Alice Hernandez is a [id number] [redacted]w[redacted]d by dates being seen today for her first obstetrical visit.  Her obstetrical history is significant for late to care and substance abuse. Patient does not intend to breast feed. Pregnancy history fully reviewed.  Patient reports no complaints.  Filed Vitals:   07/20/11 0951  BP: 116/67  Temp: 97.9 F (36.6 C)  Weight: 161 lb 4.8 oz (73.165 kg)    HISTORY: OB History    Grav Para Term Preterm Abortions TAB SAB Ect Mult Living   4 1 1  2 2    1      # Outc Date GA Lbr Len/2nd Wgt Sex Del Anes PTL Lv   1 TAB            2 TAB            3 TRM            4 CUR              Past Medical History  Diagnosis Date  . No pertinent past medical history    Past Surgical History  Procedure Date  . Laparoscopy   . Wisdom tooth extraction    Family History  Problem Relation Age of Onset  . Other Neg Hx      Exam    Uterus:     Pelvic Exam:    Perineum: No Hemorrhoids, Normal Perineum   Vulva: normal   Vagina:  normal mucosa, normal discharge   pH:    Cervix: closed, thick, long   Adnexa: not evaluated   Bony Pelvis: adequate  System: Breast:  normal appearance, no masses or tenderness   Skin: normal coloration and turgor, no rashes    Neurologic: oriented, grossly non-focal   Extremities: normal strength, tone, and muscle mass   HEENT extra ocular movement intact   Mouth/Teeth mucous membranes moist, pharynx normal without lesions and dental hygiene good   Neck supple and no masses   Cardiovascular: regular rate and rhythm   Respiratory:  chest clear, no wheezing, crepitations, rhonchi, normal symmetric air entry   Abdomen: soft gravid. Fundus 35   Urinary:       Assessment:    Pregnancy: [id number] Patient Active Problem List  Diagnosis  . Cocaine abuse complicating pregnancy  . Supervision of high-risk pregnancy        Plan:     Initial labs drawn. Prenatal vitamins. Problem list reviewed and  updated. Genetic Screening discussed : too late.  Ultrasound discussed; fetal survey: ordered.  Follow up in 1 weeks. 30 min visit spent on counseling and coordination of care.  Will perform 1 hr GCT prior to next visit Pap smears and cultures collected Patient desires BTL- will sign papers today  Ailanie Ruttan 07/20/2011

## 2011-07-21 ENCOUNTER — Other Ambulatory Visit: Payer: Medicaid Other

## 2011-07-21 ENCOUNTER — Inpatient Hospital Stay (HOSPITAL_COMMUNITY): Payer: Medicaid Other | Admitting: Anesthesiology

## 2011-07-21 ENCOUNTER — Encounter (HOSPITAL_COMMUNITY): Payer: Self-pay

## 2011-07-21 ENCOUNTER — Encounter (HOSPITAL_COMMUNITY): Payer: Self-pay | Admitting: Anesthesiology

## 2011-07-21 ENCOUNTER — Inpatient Hospital Stay (HOSPITAL_COMMUNITY)
Admission: AD | Admit: 2011-07-21 | Discharge: 2011-07-24 | DRG: 775 | Disposition: A | Discharge status: Home / Self Care | Payer: Medicaid Other | Source: Ambulatory Visit | Attending: Obstetrics & Gynecology | Admitting: Obstetrics & Gynecology

## 2011-07-21 DIAGNOSIS — F1110 Opioid abuse, uncomplicated: Secondary | ICD-10-CM | POA: Diagnosis present

## 2011-07-21 DIAGNOSIS — F1410 Cocaine abuse, uncomplicated: Secondary | ICD-10-CM | POA: Diagnosis present

## 2011-07-21 DIAGNOSIS — O99344 Other mental disorders complicating childbirth: Secondary | ICD-10-CM | POA: Diagnosis present

## 2011-07-21 DIAGNOSIS — O0930 Supervision of pregnancy with insufficient antenatal care, unspecified trimester: Secondary | ICD-10-CM

## 2011-07-21 DIAGNOSIS — F1210 Cannabis abuse, uncomplicated: Secondary | ICD-10-CM | POA: Diagnosis present

## 2011-07-21 LAB — CBC
HCT: 31.1 % — ABNORMAL LOW (ref 36.0–46.0)
Platelets: 185 10*3/uL (ref 150–400)
RDW: 14.5 % (ref 11.5–15.5)
WBC: 10.8 10*3/uL — ABNORMAL HIGH (ref 4.0–10.5)

## 2011-07-21 LAB — DRUG SCREEN, URINE, NO CONFIRMATION
Amphetamine Screen, Ur: NEGATIVE
Barbiturate Quant, Ur: NEGATIVE
Marijuana Metabolite: POSITIVE — AB
Methadone: NEGATIVE
Opiate Screen, Urine: POSITIVE — AB

## 2011-07-21 LAB — HIV ANTIBODY (ROUTINE TESTING W REFLEX): HIV: NONREACTIVE

## 2011-07-21 MED ORDER — EPHEDRINE 5 MG/ML INJ: 10.0000 mg | INTRAVENOUS | Status: DC | PRN

## 2011-07-21 MED ORDER — OXYTOCIN 40 UNITS IN LACTATED RINGERS INFUSION - SIMPLE MED
1.0000 m[IU]/min | INTRAVENOUS | Status: DC
  Administered 2011-07-21: 2 m[IU]/min via INTRAVENOUS
  Filled 2011-07-21: qty 1000

## 2011-07-21 MED ORDER — IBUPROFEN 600 MG PO TABS: 600.0000 mg | ORAL_TABLET | Freq: Four times a day (QID) | ORAL | Status: DC | PRN

## 2011-07-21 MED ORDER — ONDANSETRON HCL 4 MG/2ML IJ SOLN: 4.0000 mg | Freq: Four times a day (QID) | INTRAMUSCULAR | Status: DC | PRN

## 2011-07-21 MED ORDER — PHENYLEPHRINE 40 MCG/ML (10ML) SYRINGE FOR IV PUSH (FOR BLOOD PRESSURE SUPPORT): 80.0000 ug | PREFILLED_SYRINGE | INTRAVENOUS | Status: DC | PRN

## 2011-07-21 MED ORDER — BUTORPHANOL TARTRATE 2 MG/ML IJ SOLN: 1.0000 mg | INTRAMUSCULAR | Status: DC | PRN

## 2011-07-21 MED ORDER — FLEET ENEMA 7-19 GM/118ML RE ENEM: 1.0000 | ENEMA | RECTAL | Status: DC | PRN

## 2011-07-21 MED ORDER — LIDOCAINE HCL (PF) 1 % IJ SOLN
INTRAMUSCULAR | Status: DC | PRN
  Administered 2011-07-21 (×2): 5 mL

## 2011-07-21 MED ORDER — TERBUTALINE SULFATE 1 MG/ML IJ SOLN: 0.2500 mg | Freq: Once | INTRAMUSCULAR | Status: AC | PRN

## 2011-07-21 MED ORDER — DIPHENHYDRAMINE HCL 50 MG/ML IJ SOLN: 12.5000 mg | INTRAMUSCULAR | Status: DC | PRN

## 2011-07-21 MED ORDER — FENTANYL CITRATE 0.05 MG/ML IJ SOLN: 100.0000 ug | INTRAMUSCULAR | Status: DC | PRN

## 2011-07-21 MED ORDER — PHENYLEPHRINE 40 MCG/ML (10ML) SYRINGE FOR IV PUSH (FOR BLOOD PRESSURE SUPPORT)
80.0000 ug | PREFILLED_SYRINGE | INTRAVENOUS | Status: DC | PRN
  Filled 2011-07-21: qty 5

## 2011-07-21 MED ORDER — EPHEDRINE 5 MG/ML INJ
10.0000 mg | INTRAVENOUS | Status: DC | PRN
  Filled 2011-07-21: qty 4

## 2011-07-21 MED ORDER — LACTATED RINGERS IV SOLN
500.0000 mL | Freq: Once | INTRAVENOUS | Status: AC
  Administered 2011-07-21: 500 mL via INTRAVENOUS

## 2011-07-21 MED ORDER — OXYTOCIN 40 UNITS IN LACTATED RINGERS INFUSION - SIMPLE MED: 62.5000 mL/h | Freq: Once | INTRAVENOUS | Status: DC

## 2011-07-21 MED ORDER — LACTATED RINGERS IV SOLN
INTRAVENOUS | Status: DC
  Administered 2011-07-21 (×2): via INTRAVENOUS

## 2011-07-21 MED ORDER — LACTATED RINGERS IV SOLN: 500.0000 mL | INTRAVENOUS | Status: DC | PRN

## 2011-07-21 MED ORDER — LIDOCAINE HCL (PF) 1 % IJ SOLN
30.0000 mL | INTRAMUSCULAR | Status: DC | PRN
  Filled 2011-07-21: qty 30

## 2011-07-21 MED ORDER — FENTANYL 2.5 MCG/ML BUPIVACAINE 1/10 % EPIDURAL INFUSION (WH - ANES)
14.0000 mL/h | INTRAMUSCULAR | Status: DC
  Administered 2011-07-21 – 2011-07-22 (×2): 14 mL/h via EPIDURAL
  Filled 2011-07-21 (×2): qty 60

## 2011-07-21 MED ORDER — OXYCODONE-ACETAMINOPHEN 5-325 MG PO TABS: 1.0000 | ORAL_TABLET | ORAL | Status: DC | PRN

## 2011-07-21 MED ORDER — CITRIC ACID-SODIUM CITRATE 334-500 MG/5ML PO SOLN: 30.0000 mL | ORAL | Status: DC | PRN

## 2011-07-21 MED ORDER — OXYTOCIN BOLUS FROM INFUSION
250.0000 mL | Freq: Once | INTRAVENOUS | Status: AC
  Administered 2011-07-22: 250 mL via INTRAVENOUS
  Filled 2011-07-21: qty 500

## 2011-07-21 MED ORDER — ACETAMINOPHEN 325 MG PO TABS: 650.0000 mg | ORAL_TABLET | ORAL | Status: DC | PRN

## 2011-07-21 NOTE — Anesthesia Procedure Notes (Signed)
Epidural Patient location during procedure: OB Start time: 07/21/2011 10:28 PM  Staffing Anesthesiologist: Brayton Caves R Performed by: anesthesiologist   Preanesthetic Checklist Completed: patient identified, site marked, surgical consent, pre-op evaluation, timeout performed, IV checked, risks and benefits discussed and monitors and equipment checked  Epidural Patient position: sitting Prep: site prepped and draped and DuraPrep Patient monitoring: continuous pulse ox and blood pressure Approach: midline Injection technique: LOR air and LOR saline  Needle:  Needle type: Tuohy  Needle gauge: 17 G Needle length: 9 cm Needle insertion depth: 5 cm cm Catheter type: closed end flexible Catheter size: 19 Gauge Catheter at skin depth: 10 cm Test dose: negative  Assessment Events: blood not aspirated, injection not painful, no injection resistance, negative IV test and no paresthesia  Additional Notes Patient identified.  Risk benefits discussed including failed block, incomplete pain control, headache, nerve damage, paralysis, blood pressure changes, nausea, vomiting, reactions to medication both toxic or allergic, and postpartum back pain.  Patient expressed understanding and wished to proceed.  All questions were answered.  Sterile technique used throughout procedure and epidural site dressed with sterile barrier dressing. No paresthesia or other complications noted.The patient did not experience any signs of intravascular injection such as tinnitus or metallic taste in mouth nor signs of intrathecal spread such as rapid motor block. Please see nursing notes for vital signs.

## 2011-07-21 NOTE — H&P (Signed)
Alice Hernandez is a 36 y.o. female G4P1021 at 32.2 EGA presenting for rupture of membranes. History  Patient states that at about 5:20 this evening she noticed leakage of small amounts of fluid.  Since then she has continued to leak small amounts of fluid.  Also at this time she started to notice contractions.  She states they aren't close enough for her to count.  She denies any loss of blood. OB History    Grav Para Term Preterm Abortions TAB SAB Ect Mult Living   4 1 1  2 2    1      History of ovarian cyst with 2 laparoscopies for this issue. She had her first prenatal visit in [place] with Dr. Jolayne Panther yesterday.  She states she was previously seen in [place], [place], but does not remember the name of the physician.  Past Medical History  Diagnosis Date  . No pertinent past medical history    Past Surgical History  Procedure Date  . Laparoscopy   . Wisdom tooth extraction    Family History: family history is negative for Other. Social History:  reports that she has been smoking.  She does not have any smokeless tobacco history on file. She reports that she drinks alcohol. She reports that she uses illicit drugs (Marijuana). She recently had a positive drug screen for cocaine.  ROS negative except per HPI    Blood pressure 128/76, pulse 76, temperature 98.5 F (36.9 C), temperature source Oral, resp. rate 20, height 5\' 4"  (1.626 m), weight 73.165 kg (161 lb 4.8 oz). Exam Physical Exam  Constitutional: She appears well-developed and well-nourished.  Cardiovascular: Normal rate, regular rhythm and normal heart sounds.   Respiratory: Effort normal and breath sounds normal.  GI: Soft. There is no tenderness.       Gravid abdomen   Cervix: 2/70/-3  FHT: baseline 130, moderate variability, no accels, no decels Contractions: irregular, every 5-8 minutes  Prenatal labs: ABO, Rh: O/POS/-- (07/08 1125) Antibody: NEG (07/08 1125) Rubella: >500.0 (06/23 2331) RPR: NON REACTIVE (06/23  2331)  HBsAg: NEGATIVE (06/23 2331)  HIV: NON REACTIVE (07/08 1125)  GBS:   unknown  Assessment/Plan: 37 yo G4P1021 at 37.2 EGA presents with rupture of membranes.  Pregnancy complicated by late initial prenatal care and substance abuse.  Admit to birthing suites for NSVD.     Marikay Alar 07/21/2011, 7:06 PM  I have seen and examined this patient and I agree with the above. Drugs of Abuse     Component Value Date/Time   LABOPIA POS* 07/20/2011 1039   LABOPIA NONE DETECTED 07/05/2011 2321   COCAINSCRNUR NEG 07/20/2011 1039   COCAINSCRNUR POSITIVE* 07/05/2011 2321   LABBENZ NEG 07/20/2011 1039   LABBENZ NONE DETECTED 07/05/2011 2321   AMPHETMU NEG 07/20/2011 1039   AMPHETMU NONE DETECTED 07/05/2011 2321   THCU NONE DETECTED 07/05/2011 2321   LABBARB NONE DETECTED 07/05/2011 2321    Will get social work consult.  Pitocin started. Cam Hai 12:47 AM 07/22/2011

## 2011-07-21 NOTE — Anesthesia Preprocedure Evaluation (Signed)
Anesthesia Evaluation  Patient identified by MRN, date of birth, ID band Patient awake    Reviewed: Allergy & Precautions, H&P , Patient's Chart, lab work & pertinent test results  Airway Mallampati: II TM Distance: >3 FB Neck ROM: full    Dental No notable dental hx.    Pulmonary neg pulmonary ROS,  breath sounds clear to auscultation  Pulmonary exam normal       Cardiovascular negative cardio ROS  Rhythm:regular Rate:Normal     Neuro/Psych negative neurological ROS  negative psych ROS   GI/Hepatic negative GI ROS, Neg liver ROS,   Endo/Other  negative endocrine ROS  Renal/GU negative Renal ROS     Musculoskeletal   Abdominal   Peds  Hematology negative hematology ROS (+)   Anesthesia Other Findings Polysubstance abuse  Reproductive/Obstetrics (+) Pregnancy                           Anesthesia Physical Anesthesia Plan  ASA: III  Anesthesia Plan: Epidural   Post-op Pain Management:    Induction:   Airway Management Planned:   Additional Equipment:   Intra-op Plan:   Post-operative Plan:   Informed Consent: I have reviewed the patients History and Physical, chart, labs and discussed the procedure including the risks, benefits and alternatives for the proposed anesthesia with the patient or authorized representative who has indicated his/her understanding and acceptance.     Plan Discussed with:   Anesthesia Plan Comments:         Anesthesia Quick Evaluation

## 2011-07-22 ENCOUNTER — Encounter (HOSPITAL_COMMUNITY): Payer: Self-pay | Admitting: Obstetrics

## 2011-07-22 DIAGNOSIS — F1410 Cocaine abuse, uncomplicated: Secondary | ICD-10-CM

## 2011-07-22 DIAGNOSIS — F1210 Cannabis abuse, uncomplicated: Secondary | ICD-10-CM

## 2011-07-22 DIAGNOSIS — O99344 Other mental disorders complicating childbirth: Secondary | ICD-10-CM

## 2011-07-22 LAB — CBC
HCT: 30.2 % — ABNORMAL LOW (ref 36.0–46.0)
MCV: 87.5 fL (ref 78.0–100.0)
Platelets: 167 10*3/uL (ref 150–400)
RBC: 3.45 MIL/uL — ABNORMAL LOW (ref 3.87–5.11)
RDW: 14.3 % (ref 11.5–15.5)
WBC: 10.6 10*3/uL — ABNORMAL HIGH (ref 4.0–10.5)

## 2011-07-22 LAB — HEMOGLOBINOPATHY EVALUATION
Hemoglobin Other: 0 %
Hgb A2 Quant: 2.1 % — ABNORMAL LOW (ref 2.2–3.2)
Hgb A: 97.9 % — ABNORMAL HIGH (ref 96.8–97.8)
Hgb F Quant: 0 % (ref 0.0–2.0)
Hgb S Quant: 0 %

## 2011-07-22 MED ORDER — TETANUS-DIPHTH-ACELL PERTUSSIS 5-2.5-18.5 LF-MCG/0.5 IM SUSP: 0.5000 mL | Freq: Once | INTRAMUSCULAR | Status: DC

## 2011-07-22 MED ORDER — LANOLIN HYDROUS EX OINT: TOPICAL_OINTMENT | CUTANEOUS | Status: DC | PRN

## 2011-07-22 MED ORDER — BENZOCAINE-MENTHOL 20-0.5 % EX AERO
1.0000 "application " | INHALATION_SPRAY | CUTANEOUS | Status: DC | PRN
  Administered 2011-07-22: 1 via TOPICAL
  Filled 2011-07-22: qty 56

## 2011-07-22 MED ORDER — IBUPROFEN 600 MG PO TABS
600.0000 mg | ORAL_TABLET | Freq: Four times a day (QID) | ORAL | Status: DC
  Administered 2011-07-22 – 2011-07-24 (×10): 600 mg via ORAL
  Filled 2011-07-22 (×10): qty 1

## 2011-07-22 MED ORDER — ONDANSETRON HCL 4 MG/2ML IJ SOLN: 4.0000 mg | INTRAMUSCULAR | Status: DC | PRN

## 2011-07-22 MED ORDER — OXYCODONE-ACETAMINOPHEN 5-325 MG PO TABS
1.0000 | ORAL_TABLET | ORAL | Status: DC | PRN
  Administered 2011-07-22 – 2011-07-24 (×6): 1 via ORAL
  Filled 2011-07-22 (×6): qty 1

## 2011-07-22 MED ORDER — DIPHENHYDRAMINE HCL 25 MG PO CAPS: 25.0000 mg | ORAL_CAPSULE | Freq: Four times a day (QID) | ORAL | Status: DC | PRN

## 2011-07-22 MED ORDER — ZOLPIDEM TARTRATE 5 MG PO TABS: 5.0000 mg | ORAL_TABLET | Freq: Every evening | ORAL | Status: DC | PRN

## 2011-07-22 MED ORDER — METRONIDAZOLE 500 MG PO TABS
500.0000 mg | ORAL_TABLET | Freq: Two times a day (BID) | ORAL | Status: DC
  Administered 2011-07-22 – 2011-07-24 (×5): 500 mg via ORAL
  Filled 2011-07-22 (×7): qty 1

## 2011-07-22 MED ORDER — DIBUCAINE 1 % RE OINT: 1.0000 "application " | TOPICAL_OINTMENT | RECTAL | Status: DC | PRN

## 2011-07-22 MED ORDER — PRENATAL 27-0.8 MG PO TABS: 1.0000 | ORAL_TABLET | Freq: Every day | ORAL | Status: DC

## 2011-07-22 MED ORDER — WITCH HAZEL-GLYCERIN EX PADS: 1.0000 "application " | MEDICATED_PAD | CUTANEOUS | Status: DC | PRN

## 2011-07-22 MED ORDER — SIMETHICONE 80 MG PO CHEW: 80.0000 mg | CHEWABLE_TABLET | ORAL | Status: DC | PRN

## 2011-07-22 MED ORDER — PRENATAL MULTIVITAMIN CH
1.0000 | ORAL_TABLET | Freq: Every day | ORAL | Status: DC
  Administered 2011-07-22 – 2011-07-24 (×2): 1 via ORAL
  Filled 2011-07-22 (×2): qty 1

## 2011-07-22 MED ORDER — ONDANSETRON HCL 4 MG PO TABS: 4.0000 mg | ORAL_TABLET | ORAL | Status: DC | PRN

## 2011-07-22 MED ORDER — SENNOSIDES-DOCUSATE SODIUM 8.6-50 MG PO TABS
2.0000 | ORAL_TABLET | Freq: Every day | ORAL | Status: DC
  Administered 2011-07-22 – 2011-07-23 (×2): 2 via ORAL

## 2011-07-22 NOTE — Progress Notes (Signed)
UR chart review completed.  

## 2011-07-22 NOTE — Clinical Social Work Maternal (Signed)
    Clinical Social Work Department PSYCHOSOCIAL ASSESSMENT - MATERNAL/CHILD 07/22/2011  Patient:  Alice Hernandez, Alice Hernandez  Account Number:  [id number]  Admit Date:  07/21/2011  Marjo Bicker Name:   Lebron Conners    Clinical Social Worker:  Andy Gauss   Date/Time:  07/22/2011 12:11 PM  Date Referred:  07/22/2011   Referral source  CN     Referred reason  Substance Abuse   Other referral source:    I:  FAMILY / HOME ENVIRONMENT Child's legal guardian:  PARENT  Guardian - Name Guardian - Age Guardian - Address  Aloma Bo[address] [place], [place] [address]   Other household support members/support persons Name Relationship DOB   FRIEND    Other support:    II  PSYCHOSOCIAL DATA Information Source:  Patient Interview  Event organiser Employment:   Financial resources:  [employer] If Medicaid - County:  [employer] / Grade:   Maternity Care Coordinator / Child Services Coordination / Early Interventions:  Cultural issues impacting care:    III  STRENGTHS  Strength comment:    IV  RISK FACTORS AND CURRENT PROBLEMS Current Problem:  YES   Risk Factor & Current Problem Patient Issue Family Issue Risk Factor / Current Problem Comment  Substance Abuse Y N Hx of cocaine, opiate & MJ use    V  SOCIAL WORK ASSESSMENT Sw attempted to meet with pt to assess the reason for [facility] and substance use history.  Pt was reluctant to speak with this Sw about her substance use, as she questioned the reason for consult.  Pt told Sw that she did not have a problem with substances, as she used  MJ and cocaine, "one time," before pregnancy confirmation at 4 months.  Pt told Sw that a dentist in [place] prescribed the opiates to her for tooth pain. She relocated back to this area approximately, 5 months ago. Pt became defensive, as she denied regular substance use. 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  According to  the pt, she was around "drugs and prostitution," but denies any use.  As per chart review, pt had a positive drug screen results for cocaine, 07/05/11 and MJ & opiates, 07/20/11.  Sw explained hospital drug testing policy.  UDS is negative, meconium results are pending.  Pt's 69 year old son is with her mother in [place].  She denies previous CPS involvement.  Currently, she is living with a friend but spoke of plans to move back to [place] 07/31/11.  Pt reports having all the necessary supplies for the infant.  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  As a result, this Sw reported to CPS to request an investigation.  Sw available to follow and assist until discharge.      VI SOCIAL WORK PLAN Social Work Plan  Child Management consultant Report  Psychosocial Support/Ongoing Assessment of Needs   Type of pt/family education:   If child protective services report - county:  [place] If child protective services report - date:  07/22/2011 Information/referral to community resources comment:   Other social work plan:

## 2011-07-23 ENCOUNTER — Ambulatory Visit (HOSPITAL_COMMUNITY): Payer: Medicaid Other

## 2011-07-23 MED ORDER — LORAZEPAM 2 MG/ML IJ SOLN
INTRAMUSCULAR | Status: AC
  Filled 2011-07-23: qty 1

## 2011-07-23 MED ORDER — LORAZEPAM 2 MG/ML IJ SOLN
1.0000 mg | INTRAMUSCULAR | Status: AC
  Administered 2011-07-23: 1 mg via INTRAMUSCULAR

## 2011-07-23 NOTE — Significant Event (Signed)
Rapid Response Event Note  Overview: Time Called: 1030 Arrival Time: 1033 Event Type: Respiratory  Initial Focused Assessment: Tacypnea; Shortness of breath after conversation with CPS. Complains of chest pain; appears to be having an anxiety attack. Pt. Admitted she has had anxiety attacks before.    Interventions: O2 sats 100%; but Venturi mask applied to patient for comfort; IM 1 mg Ativan given per MB RN; Emotional support provided and helpful   Event Summary: Patient's symptoms resolved over 30 min and patient stable; Dr. Adrian Blackwater present for event Name of Physician Notified: Dr. Adrian Blackwater at      at    Outcome: Stayed in room and stabalized  Event End Time: 1115  Trudi Ida

## 2011-07-23 NOTE — Anesthesia Postprocedure Evaluation (Signed)
Anesthesia Post Note  Patient: Alice Hernandez  Procedure(s) Performed: * No procedures listed *  Anesthesia type: Epidural  Patient location: Mother/Baby  Post pain: Pain level controlled  Post assessment: Post-op Vital signs reviewed  Last Vitals:  Filed Vitals:   07/23/11 0621  BP: 133/80  Pulse: 68  Temp: 36.6 C  Resp: 18    Post vital signs: Reviewed  Level of consciousness: awake  Complications: No apparent anesthesia complications

## 2011-07-23 NOTE — Progress Notes (Signed)
I have seen and examined this patient and I agree with the above. Cam Hai 8:44 AM 07/23/2011

## 2011-07-23 NOTE — Progress Notes (Signed)
Nutrition Note:  (1st visit consult) Pt seen for late [facility] and substance abuse (cigarette smoker). Current wt gain of 31.3# is within normal limits for [redacted]w[redacted]d. Pt reports intake when hungry, overall limited variety of foods due to homelessness.  No n/v reported and no food allergies.  Pt does not currently receive WIC services but appt given.   Pt does not intend to BF. Disc increasing high iron foods as able.  Follow up if referred.  Wilford Sports, RD

## 2011-07-23 NOTE — Progress Notes (Signed)
S: Pt doing well. She plans on staying in [place] with a friend and will follow-up with clinic.  Pain controlled: Yes. Lochia: decreased.  Eating/drinking: Yes. Flatus: Yes. BM: No. Voiding: Yes. Ambulating: Yes. Bottle feeding well.   O: Filed Vitals:   07/23/11 0621  BP: 133/80  Pulse: 68  Temp: 97.8 F (36.6 C)  Resp: 18    Gen: NAD, doing well CV: RRR Pulm: CTAB Abd: soft, + bowel sounds, Fundus firm Ext: 1+ pitting B/L LE  Results for orders placed during the hospital encounter of 07/21/11 (from the past 72 hour(s))  CBC     Status: Abnormal   Collection Time   07/21/11  6:18 PM      Component Value Range Comment   WBC 10.8 (*) 4.0 - 10.5 K/uL    RBC 3.53 (*) 3.87 - 5.11 MIL/uL    Hemoglobin 10.2 (*) 12.0 - 15.0 g/dL    HCT 04.5 (*) 40.9 - 46.0 %    MCV 88.1  78.0 - 100.0 fL    MCH 28.9  26.0 - 34.0 pg    MCHC 32.8  30.0 - 36.0 g/dL    RDW 81.1  91.4 - 78.2 %    Platelets 185  150 - 400 K/uL   RPR     Status: Normal   Collection Time   07/21/11  6:18 PM      Component Value Range Comment   RPR NON REACTIVE  NON REACTIVE   CBC     Status: Abnormal   Collection Time   07/22/11  5:35 AM      Component Value Range Comment   WBC 10.6 (*) 4.0 - 10.5 K/uL    RBC 3.45 (*) 3.87 - 5.11 MIL/uL    Hemoglobin 10.1 (*) 12.0 - 15.0 g/dL    HCT 95.6 (*) 21.3 - 46.0 %    MCV 87.5  78.0 - 100.0 fL    MCH 29.3  26.0 - 34.0 pg    MCHC 33.4  30.0 - 36.0 g/dL    RDW 08.6  57.8 - 46.9 %    Platelets 167  150 - 400 K/uL      A/P: 36 y.o. year old [id number] PPD# 1 s/p SVD w/o complications -female/ circumcision Yes/ bottle/ birth control: Nexplanon -Continue routine post-partum care. -Anticipate d/c PPD #2 -F/u in 6 weeks at GYN clinic

## 2011-07-24 MED ORDER — PNEUMOCOCCAL VAC POLYVALENT 25 MCG/0.5ML IJ INJ
0.5000 mL | INJECTION | INTRAMUSCULAR | Status: DC
  Filled 2011-07-24: qty 0.5

## 2011-07-24 MED ORDER — HYDROXYZINE HCL 25 MG PO TABS
25.0000 mg | ORAL_TABLET | Freq: Once | ORAL | Status: AC
  Administered 2011-07-24: 25 mg via ORAL
  Filled 2011-07-24: qty 1

## 2011-07-24 MED ORDER — HYDROXYZINE PAMOATE 25 MG PO CAPS: 25.0000 mg | ORAL_CAPSULE | Freq: Three times a day (TID) | ORAL | Status: AC | PRN

## 2011-07-24 MED ORDER — SERTRALINE HCL 50 MG PO TABS: ORAL_TABLET | ORAL | Status: DC

## 2011-07-24 MED ORDER — IBUPROFEN 600 MG PO TABS: 600.0000 mg | ORAL_TABLET | Freq: Four times a day (QID) | ORAL | Status: AC

## 2011-07-24 NOTE — Discharge Summary (Signed)
Patient seen and examined.  Agree with above note.  Levie Heritage, DO 07/24/2011 7:38 AM

## 2011-07-24 NOTE — Discharge Summary (Signed)
Obstetric Discharge Summary Reason for Admission: 35yo who came in with [facility].  She had very late prenatal care (moved here from [facility]) and insufficient care even before moving.  She had two UDS that were positive for illegal substances, one for cocaine and the other for marijuana and opiates. Spontaneous vaginal delivery at 37 weeks 3 days. Prenatal Procedures: ultrasound Intrapartum Procedures: spontaneous vaginal delivery Postpartum Procedures: none Complications-Operative and Postpartum: 1st  degree perineal laceration  Pt reports feeling more relaxed today; denies chest pain. Has been up ad lib, walking regularly. Continues to have some low abdominal discomfort, bleeding and pain decreased. No difficulty voiding, has passed stool.  Hemoglobin  Date Value Range Status  07/22/2011 10.1* 12.0 - 15.0 g/dL Final     HCT  Date Value Range Status  07/22/2011 30.2* 36.0 - 46.0 % Final    Physical Exam:  General: alert, cooperative and no distress Lochia: appropriate, decreasing Uterine Fundus: firm DVT Evaluation: No evidence of DVT seen on physical exam.  Discharge Diagnoses: Term Pregnancy-delivered  Discharge Information: Date: 07/24/2011 Activity: unrestricted Diet: routine Medications: Ibuprofen Condition: stable Instructions: refer to practice specific booklet Discharge to: home with mother    Newborn Data: Live born female  Birth Weight: 5 lb 9.4 oz (2535 g) APGAR: 9, 9  Home with mother.  Social work to meet with pt prior to discharge to further discuss social issues.  Amy Maxwell Caul 07/24/2011, 7:23 AM

## 2011-07-24 NOTE — Progress Notes (Signed)
CPS staff agreed to allow the infant to discharge home with Alice Hernandez, pt's mother pending criminal background and approved home study.  CPS worker, Bernard Douglas has contacted department of social services in [place], [place], requesting a courtesy home visit.  If CPS is unable to gain approval by 5 o'clock, this infant should remain here over the weekend.  MOB is emotional however aware of plan.  She understands she can come visit, as long as infant is here and was instructed not to remove her arm band.   Sw will be available to assist and facilitate safe discharge. 

## 2011-07-27 ENCOUNTER — Encounter: Payer: Medicaid Other | Admitting: Family Medicine

## 2011-08-02 NOTE — H&P (Signed)
Agree with above note.  Alice Hernandez 08/02/2011 7:27 AM

## 2011-08-19 ENCOUNTER — Ambulatory Visit: Payer: Self-pay | Admitting: Advanced Practice Midwife

## 2013-11-13 ENCOUNTER — Encounter (HOSPITAL_COMMUNITY): Payer: Self-pay | Admitting: Obstetrics

## 2015-10-17 ENCOUNTER — Ambulatory Visit: Payer: Self-pay | Admitting: Obstetrics & Gynecology

## 2015-10-20 ENCOUNTER — Inpatient Hospital Stay (HOSPITAL_COMMUNITY)
Admission: EM | Admit: 2015-10-20 | Discharge: 2015-10-24 | DRG: 443 | Disposition: A | Discharge status: Home / Self Care | Payer: Medicaid Other | Attending: Nephrology | Admitting: Nephrology

## 2015-10-20 ENCOUNTER — Encounter (HOSPITAL_COMMUNITY): Payer: Self-pay | Admitting: Nurse Practitioner

## 2015-10-20 DIAGNOSIS — R7401 Elevation of levels of liver transaminase levels: Secondary | ICD-10-CM | POA: Diagnosis present

## 2015-10-20 DIAGNOSIS — Z87891 Personal history of nicotine dependence: Secondary | ICD-10-CM

## 2015-10-20 DIAGNOSIS — R740 Nonspecific elevation of levels of transaminase and lactic acid dehydrogenase [LDH]: Secondary | ICD-10-CM | POA: Diagnosis present

## 2015-10-20 DIAGNOSIS — M791 Myalgia, unspecified site: Secondary | ICD-10-CM

## 2015-10-20 DIAGNOSIS — M79604 Pain in right leg: Secondary | ICD-10-CM | POA: Diagnosis present

## 2015-10-20 DIAGNOSIS — B169 Acute hepatitis B without delta-agent and without hepatic coma: Principal | ICD-10-CM

## 2015-10-20 DIAGNOSIS — M79605 Pain in left leg: Secondary | ICD-10-CM | POA: Diagnosis present

## 2015-10-20 HISTORY — DX: Other specified health status: Z78.9

## 2015-10-20 NOTE — ED Triage Notes (Signed)
Pt c/o left knee pain with swelling, the pain has been ongoing but the swelling onset was today. [facility] was given Toradol IM and since then has been experiencing muscle twitching and generalized joint pain.

## 2015-10-21 ENCOUNTER — Encounter (HOSPITAL_COMMUNITY): Payer: Self-pay | Admitting: *Deleted

## 2015-10-21 ENCOUNTER — Inpatient Hospital Stay (HOSPITAL_COMMUNITY): Payer: Medicaid Other

## 2015-10-21 DIAGNOSIS — B169 Acute hepatitis B without delta-agent and without hepatic coma: Secondary | ICD-10-CM | POA: Diagnosis not present

## 2015-10-21 DIAGNOSIS — M2550 Pain in unspecified joint: Secondary | ICD-10-CM | POA: Diagnosis not present

## 2015-10-21 DIAGNOSIS — M79609 Pain in unspecified limb: Secondary | ICD-10-CM | POA: Diagnosis not present

## 2015-10-21 DIAGNOSIS — M79604 Pain in right leg: Secondary | ICD-10-CM | POA: Diagnosis present

## 2015-10-21 DIAGNOSIS — R7401 Elevation of levels of liver transaminase levels: Secondary | ICD-10-CM | POA: Diagnosis present

## 2015-10-21 DIAGNOSIS — Z87891 Personal history of nicotine dependence: Secondary | ICD-10-CM | POA: Diagnosis not present

## 2015-10-21 DIAGNOSIS — R740 Nonspecific elevation of levels of transaminase and lactic acid dehydrogenase [LDH]: Secondary | ICD-10-CM

## 2015-10-21 DIAGNOSIS — M79605 Pain in left leg: Secondary | ICD-10-CM | POA: Diagnosis present

## 2015-10-21 DIAGNOSIS — M791 Myalgia: Secondary | ICD-10-CM | POA: Diagnosis present

## 2015-10-21 LAB — CBC WITH DIFFERENTIAL/PLATELET
BASOS ABS: 0.1 10*3/uL (ref 0.0–0.1)
BASOS PCT: 1 %
EOS PCT: 4 %
Eosinophils Absolute: 0.2 10*3/uL (ref 0.0–0.7)
HEMATOCRIT: 34.7 % — AB (ref 36.0–46.0)
Hemoglobin: 11.6 g/dL — ABNORMAL LOW (ref 12.0–15.0)
LYMPHS PCT: 39 %
Lymphs Abs: 1.6 10*3/uL (ref 0.7–4.0)
MCH: 29.5 pg (ref 26.0–34.0)
MCHC: 33.4 g/dL (ref 30.0–36.0)
MCV: 88.3 fL (ref 78.0–100.0)
MONO ABS: 0.6 10*3/uL (ref 0.1–1.0)
Monocytes Relative: 15 %
Neutro Abs: 1.8 10*3/uL (ref 1.7–7.7)
Neutrophils Relative %: 41 %
Platelets: 185 10*3/uL (ref 150–400)
RBC: 3.93 MIL/uL (ref 3.87–5.11)
RDW: 13.6 % (ref 11.5–15.5)
WBC: 4.2 10*3/uL (ref 4.0–10.5)

## 2015-10-21 LAB — BASIC METABOLIC PANEL
Anion gap: 6 (ref 5–15)
BUN: 13 mg/dL (ref 6–20)
CALCIUM: 7.9 mg/dL — AB (ref 8.9–10.3)
CO2: 20 mmol/L — AB (ref 22–32)
CREATININE: 0.59 mg/dL (ref 0.44–1.00)
Chloride: 111 mmol/L (ref 101–111)
GFR calc Af Amer: >60 mL/min (ref >60)
GLUCOSE: 156 mg/dL — AB (ref 65–99)
Potassium: 4.3 mmol/L (ref 3.5–5.1)
Sodium: 137 mmol/L (ref 135–145)

## 2015-10-21 LAB — COMPREHENSIVE METABOLIC PANEL
ALK PHOS: 172 U/L — AB (ref 38–126)
ALT: 1220 U/L — AB (ref 14–54)
AST: 863 U/L — AB (ref 15–41)
Albumin: 3.2 g/dL — ABNORMAL LOW (ref 3.5–5.0)
Anion gap: 5 (ref 5–15)
BUN: 18 mg/dL (ref 6–20)
CALCIUM: 8.4 mg/dL — AB (ref 8.9–10.3)
CHLORIDE: 110 mmol/L (ref 101–111)
CO2: 21 mmol/L — AB (ref 22–32)
CREATININE: 0.68 mg/dL (ref 0.44–1.00)
GFR calc non Af Amer: >60 mL/min (ref >60)
GLUCOSE: 94 mg/dL (ref 65–99)
Potassium: 4 mmol/L (ref 3.5–5.1)
SODIUM: 136 mmol/L (ref 135–145)
Total Bilirubin: 0.7 mg/dL (ref 0.3–1.2)
Total Protein: 6.3 g/dL — ABNORMAL LOW (ref 6.5–8.1)

## 2015-10-21 LAB — URIC ACID: URIC ACID, SERUM: 4.5 mg/dL (ref 2.3–6.6)

## 2015-10-21 LAB — CBC
HEMATOCRIT: 35.2 % — AB (ref 36.0–46.0)
Hemoglobin: 11.7 g/dL — ABNORMAL LOW (ref 12.0–15.0)
MCH: 29.5 pg (ref 26.0–34.0)
MCHC: 33.2 g/dL (ref 30.0–36.0)
MCV: 88.7 fL (ref 78.0–100.0)
PLATELETS: 191 10*3/uL (ref 150–400)
RBC: 3.97 MIL/uL (ref 3.87–5.11)
RDW: 13.6 % (ref 11.5–15.5)
WBC: 3.8 10*3/uL — ABNORMAL LOW (ref 4.0–10.5)

## 2015-10-21 LAB — PROTIME-INR
INR: 1.34
Prothrombin Time: 16.7 seconds — ABNORMAL HIGH (ref 11.4–15.2)

## 2015-10-21 LAB — C-REACTIVE PROTEIN: CRP: 1.8 mg/dL — ABNORMAL HIGH (ref <1.0)

## 2015-10-21 LAB — ACETAMINOPHEN LEVEL: ACETAMINOPHEN (TYLENOL), SERUM: 11 ug/mL (ref 10–30)

## 2015-10-21 LAB — SEDIMENTATION RATE: SED RATE: 9 mm/h (ref 0–22)

## 2015-10-21 MED ORDER — ACETYLCYSTEINE 200 MG/ML IV SOLN: 15.0000 mg/kg/h | INTRAVENOUS | Status: DC

## 2015-10-21 MED ORDER — ACETYLCYSTEINE LOAD VIA INFUSION: 150.0000 mg/kg | Freq: Once | INTRAVENOUS | Status: DC

## 2015-10-21 MED ORDER — LORAZEPAM 2 MG/ML IJ SOLN
0.5000 mg | Freq: Once | INTRAMUSCULAR | Status: AC
  Administered 2015-10-21: 0.5 mg via INTRAVENOUS
  Filled 2015-10-21: qty 1

## 2015-10-21 MED ORDER — FENTANYL CITRATE (PF) 100 MCG/2ML IJ SOLN
25.0000 ug | INTRAMUSCULAR | Status: DC | PRN
  Administered 2015-10-21 – 2015-10-23 (×11): 25 ug via INTRAVENOUS
  Filled 2015-10-21 (×11): qty 2

## 2015-10-21 MED ORDER — ACETYLCYSTEINE LOAD VIA INFUSION
150.0000 mg/kg | Freq: Once | INTRAVENOUS | Status: DC
  Filled 2015-10-21: qty 274

## 2015-10-21 MED ORDER — SODIUM CHLORIDE 0.9 % IV BOLUS (SEPSIS)
1000.0000 mL | Freq: Once | INTRAVENOUS | Status: AC
  Administered 2015-10-21: 1000 mL via INTRAVENOUS

## 2015-10-21 MED ORDER — NICOTINE 7 MG/24HR TD PT24
7.0000 mg | MEDICATED_PATCH | Freq: Every day | TRANSDERMAL | Status: DC
  Administered 2015-10-21 – 2015-10-23 (×3): 7 mg via TRANSDERMAL
  Filled 2015-10-21 (×4): qty 1

## 2015-10-21 MED ORDER — ACETYLCYSTEINE 200 MG/ML IV SOLN
15.0000 mg/kg/h | INTRAVENOUS | Status: DC
  Administered 2015-10-21: 15 mg/kg/h via INTRAVENOUS
  Filled 2015-10-21: qty 170

## 2015-10-21 MED ORDER — ONDANSETRON HCL 4 MG/2ML IJ SOLN
4.0000 mg | Freq: Once | INTRAMUSCULAR | Status: AC
  Administered 2015-10-21: 4 mg via INTRAVENOUS
  Filled 2015-10-21: qty 2

## 2015-10-21 MED ORDER — ACETYLCYSTEINE LOAD VIA INFUSION
150.0000 mg/kg | Freq: Once | INTRAVENOUS | Status: AC
  Administered 2015-10-21: 10950 mg via INTRAVENOUS
  Filled 2015-10-21: qty 274

## 2015-10-21 MED ORDER — PREDNISONE 20 MG PO TABS
60.0000 mg | ORAL_TABLET | Freq: Once | ORAL | Status: AC
  Administered 2015-10-21: 60 mg via ORAL
  Filled 2015-10-21: qty 3

## 2015-10-21 MED ORDER — COLCHICINE 0.6 MG PO TABS
0.6000 mg | ORAL_TABLET | Freq: Two times a day (BID) | ORAL | Status: DC
  Administered 2015-10-21 – 2015-10-24 (×7): 0.6 mg via ORAL
  Filled 2015-10-21 (×7): qty 1

## 2015-10-21 MED ORDER — ACETYLCYSTEINE 200 MG/ML IV SOLN
15.0000 mg/kg/h | INTRAVENOUS | Status: DC
  Administered 2015-10-21: 15 mg/kg/h via INTRAVENOUS
  Filled 2015-10-21: qty 200

## 2015-10-21 MED ORDER — ENOXAPARIN SODIUM 40 MG/0.4ML ~~LOC~~ SOLN
40.0000 mg | SUBCUTANEOUS | Status: DC
  Administered 2015-10-21 – 2015-10-24 (×4): 40 mg via SUBCUTANEOUS
  Filled 2015-10-21 (×4): qty 0.4

## 2015-10-21 MED ORDER — DEXTROSE 5 % IV SOLN: 15.0000 mg/kg/h | INTRAVENOUS | Status: DC

## 2015-10-21 MED ORDER — FENTANYL CITRATE (PF) 100 MCG/2ML IJ SOLN
50.0000 ug | Freq: Once | INTRAMUSCULAR | Status: AC
  Administered 2015-10-21: 50 ug via INTRAVENOUS
  Filled 2015-10-21: qty 2

## 2015-10-21 MED ORDER — SODIUM CHLORIDE 0.9 % IV SOLN
Freq: Once | INTRAVENOUS | Status: AC
  Administered 2015-10-21: 04:00:00 via INTRAVENOUS

## 2015-10-21 NOTE — ED Provider Notes (Signed)
[facility]-EMERGENCY DEPT Provider Note   CSN: [id number] Arrival date & time: 10/20/15  2256   By signing my name below, I, Valentino Saxon, attest that this documentation has been prepared under the direction and in the presence of  Earley Favor, NP. Electronically Signed: Valentino Saxon, ED Scribe. 10/21/15. 12:48 AM.  History   Chief Complaint Chief Complaint  Patient presents with  . Knee Pain    Left Knee   The history is provided by the patient. No language interpreter was used.     HPI Comments: Alice Hernandez is a 40 y.o. female who presents to the Emergency Department complaining of moderate, ongoing left knee pain which worsened last night. Pt also complains of right knee swelling, ongoing left foot and ankle pain. She states having worsened pain with bearing weight, ambulation and movement. She was seen at [facility] yesterday for the same complaints and was given a shot of Toradol with temporary relief. Pt also reports taking Tylenol, Advil, and BC powders with minimal relief. 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 She notes she has tried wearing insoles; however her ongoing foot and ankle pain have not improved. Pt denies traveling long distances or recent periods of immbolization. She has no h/o gout, but does have Fhx of gout. Pt also reports generalized myalgias and chills for 3 days. She also complains of a pruritic rash on her right neck. She denies any new use of soap, lotions or detergents. Pt states her BMs have been regular. She denies h/o STDs, recent risky behavior. Pt states she has not received a flu shot this year. She denies vaginal discharge, dysuria, nausea, vomiting, SOB, cough, or fever.   Past Medical History:  Diagnosis Date  . No pertinent past medical history     Patient Active Problem List   Diagnosis Date Noted  . Supervision of high-risk pregnancy 07/20/2011  . Undesired fertility 07/20/2011  . Cocaine abuse  complicating pregnancy 07/09/2011    Past Surgical History:  Procedure Laterality Date  . LAPAROSCOPY    . WISDOM TOOTH EXTRACTION      OB History    Gravida Para Term Preterm AB Living   4 2 2   2 2    SAB TAB Ectopic Multiple Live Births     2     1       Home Medications    Prior to Admission medications   Medication Sig Start Date End Date Taking? Authorizing Provider  acetaminophen (TYLENOL) 500 MG tablet Take 1,500 mg by mouth every 6 (six) hours as needed for mild pain, moderate pain or headache.   Yes Historical Provider, MD  Prenatal Vit-Fe Fumarate-FA (MULTIVITAMIN-PRENATAL) 27-0.8 MG TABS Take 1 tablet by mouth daily. Patient not taking: Reported on 10/21/2015 07/06/11   Andrena Mews, DO  sertraline (ZOLOFT) 50 MG tablet Break one 50 mg tablet in half for the first 6 days to take 25 mg by mouth per day for the first 6 days. Then take one 50 mg tablet by mouth per day Patient not taking: Reported on 10/21/2015 07/24/11   Glori Luis, MD    Family History Family History  Problem Relation Age of Onset  . Other Neg Hx     Social History Social History  Substance Use Topics  . Smoking status: Current Every Day Smoker    Packs/day: 0.25  . Smokeless tobacco: Not on file  . Alcohol use Yes  Allergies   Review of patient's allergies indicates no known allergies.   Review of Systems Review of Systems  Constitutional: Negative for fever.  Respiratory: Negative for cough and shortness of breath.   Gastrointestinal: Negative for nausea and vomiting.  Genitourinary: Negative for dysuria and vaginal discharge.  Musculoskeletal: Positive for arthralgias and myalgias.  Skin: Positive for rash.  All other systems reviewed and are negative.    Physical Exam Updated Vital Signs BP 115/71 (BP Location: Right Arm)   Pulse 78   Temp 97.6 F (36.4 C) (Oral)   Resp 18   SpO2 100%   Physical Exam  Constitutional: She appears well-developed and  well-nourished. No distress.  HENT:  Head: Normocephalic and atraumatic.  Mouth/Throat: Oropharynx is clear and moist.  Eyes: Conjunctivae are normal.  Neck: Neck supple.  Cardiovascular: Normal rate and regular rhythm.   No murmur heard. Pulmonary/Chest: Effort normal and breath sounds normal. No respiratory distress.  Lungs CTA bilaterally. Subjectively SOB although Pt denies shortness of breath.    Abdominal: Soft. There is no tenderness.  Musculoskeletal: She exhibits tenderness. She exhibits no edema.  Tenderness everywhere you touch.   Neurological: She is alert.  Skin: Skin is warm and dry. No rash noted.  Superficial abrasion 5 cm x 3 cm where Pt has been scratching at base of neck over clavicle on right. No rash appreciated.   Psychiatric: She has a normal mood and affect.  Nursing note and vitals reviewed.    ED Treatments / Results   DIAGNOSTIC STUDIES: Oxygen Saturation is 100% on RA, normal by my interpretation.    COORDINATION OF CARE: 12:09 AM Discussed treatment plan with pt at bedside which includes labs, IV fluids, and XR of left knee and pt agreed to plan.  Labs (all labs ordered are listed, but only abnormal results are displayed) Labs Reviewed  CBC WITH DIFFERENTIAL/PLATELET  C-REACTIVE PROTEIN  SEDIMENTATION RATE  COMPREHENSIVE METABOLIC PANEL    EKG  EKG Interpretation None       Radiology No results found.  Procedures Procedures (including critical care time)  Medications Ordered in ED Medications  sodium chloride 0.9 % bolus 1,000 mL (not administered)  fentaNYL (SUBLIMAZE) injection 50 mcg (not administered)  ondansetron (ZOFRAN) injection 4 mg (not administered)     Initial Impression / Assessment and Plan / ED Course  I have reviewed the triage vital signs and the nursing notes.  Pertinent labs & imaging results that were available during my care of the patient were reviewed by me and considered in my medical decision making  (see chart for details).  Clinical Course   Elevated LFT will start Mucomist drip admit to hospital Spoke with Poision control for continued monitoring     Final Clinical Impressions(s) / ED Diagnoses   Final diagnoses:  None    New Prescriptions New Prescriptions   No medications on file   I personally performed the services described in this documentation, which was scribed in my presence. The recorded information has been reviewed and is accurate.    Earley FavorGail Jhon Mallozzi, NP 10/21/15 2002    Lorre NickAnthony Allen, MD 10/22/15 1120

## 2015-10-21 NOTE — Progress Notes (Signed)
This shift pt arrived to unit room 1506 via stretcher. Alert and oriented x 4, unsteady gait, general weakness. VS taken and pt oriented to room and callbell with no complications. Pt guide at the bedside. No complaint of pain at this time, will continue to monitor

## 2015-10-21 NOTE — ED Notes (Signed)
Pt up using bed pan in room assisted pt back to bed with new socks and warm blankets no respiratory or acute distress noted alert and oriented x 3 visitor at bedside call light in reach.

## 2015-10-21 NOTE — ED Notes (Signed)
Delayed in blood work due to US is at bedside

## 2015-10-21 NOTE — ED Notes (Signed)
Artis DelayChristopher Hines (c) [phone]  Shawnie Ponsngela Hines (c) [phone].     House number [phone].

## 2015-10-21 NOTE — H&P (Signed)
History and Physical    Alice Bookmanaisha Hernandez ZOX:[id number] DOB: 03-Oct-1975 DOA: 10/20/2015   PCP: No PCP Per Patient Chief Complaint:  Chief Complaint  Patient presents with  . Knee Pain    Left Knee    HPI: Alice Bookmanaisha Mcelhannon is a 40 y.o. female with medical history significant of being previously healthy.  She works as a Child psychotherapistwaitress.  About a week ago she started to develop B foot and B knee pain.  She attributed this to being on her feet all the time.  For the past couple of days she has been taking BC powders with minimal relief.  She has had generalized myalgias and chills for the past 3 days.  ED Course: During the patients ED work up, lab work shows very elevated LFTs!  Review of Systems: As per HPI otherwise 10 point review of systems negative.    Past Medical History:  Diagnosis Date  . No pertinent past medical history     Past Surgical History:  Procedure Laterality Date  . LAPAROSCOPY    . WISDOM TOOTH EXTRACTION       reports that she has been smoking.  She has been smoking about 0.25 packs per day. She does not have any smokeless tobacco history on file. She reports that she drinks alcohol. She reports that she uses drugs, including Marijuana and Cocaine.  No Known Allergies  Family History  Problem Relation Age of Onset  . Other Neg Hx       Prior to Admission medications   Not on File    Physical Exam: Vitals:   10/20/15 2306 10/21/15 0151 10/21/15 0402 10/21/15 0406  BP: 115/71 123/86 115/75   Pulse: 78 (!) 57 86   Resp: 18 16 18    Temp: 97.6 F (36.4 C)  97.6 F (36.4 C)   TempSrc: Oral  Oral   SpO2: 100% 100% 96%   Weight:    73 kg (161 lb)      Constitutional: NAD, calm, comfortable Eyes: PERRL, lids and conjunctivae normal ENMT: Mucous membranes are moist. Posterior pharynx clear of any exudate or lesions.Normal dentition.  Neck: normal, supple, no masses, no thyromegaly Respiratory: clear to auscultation bilaterally, no wheezing, no crackles.  Normal respiratory effort. No accessory muscle use.  Cardiovascular: Regular rate and rhythm, no murmurs / rubs / gallops. No extremity edema. 2+ pedal pulses. No carotid bruits.  Abdomen: no tenderness, no masses palpated. No hepatosplenomegaly. Bowel sounds positive.  Musculoskeletal: no clubbing / cyanosis. No joint deformity upper and lower extremities. Good ROM, no contractures. Normal muscle tone.  Skin: no rashes, lesions, ulcers. No induration Neurologic: CN 2-12 grossly intact. Sensation intact, DTR normal. Strength 5/5 in all 4.  Psychiatric: Normal judgment and insight. Alert and oriented x 3. Normal mood.    Labs on Admission: I have personally reviewed following labs and imaging studies  CBC:  Recent Labs Lab 10/21/15 0050  WBC 4.2  NEUTROABS 1.8  HGB 11.6*  HCT 34.7*  MCV 88.3  PLT 185   Basic Metabolic Panel:  Recent Labs Lab 10/21/15 0050  NA 136  K 4.0  CL 110  CO2 21*  GLUCOSE 94  BUN 18  CREATININE 0.68  CALCIUM 8.4*   GFR: CrCl cannot be calculated (Unknown ideal weight.). Liver Function Tests:  Recent Labs Lab 10/21/15 0050  AST 863*  ALT 1,220*  ALKPHOS 172*  BILITOT 0.7  PROT 6.3*  ALBUMIN 3.2*   No results for input(s): LIPASE, AMYLASE in the last  168 hours. No results for input(s): AMMONIA in the last 168 hours. Coagulation Profile: No results for input(s): INR, PROTIME in the last 168 hours. Cardiac Enzymes: No results for input(s): CKTOTAL, CKMB, CKMBINDEX, TROPONINI in the last 168 hours. BNP (last 3 results) No results for input(s): PROBNP in the last 8760 hours. HbA1C: No results for input(s): HGBA1C in the last 72 hours. CBG: No results for input(s): GLUCAP in the last 168 hours. Lipid Profile: No results for input(s): CHOL, HDL, LDLCALC, TRIG, CHOLHDL, LDLDIRECT in the last 72 hours. Thyroid Function Tests: No results for input(s): TSH, T4TOTAL, FREET4, T3FREE, THYROIDAB in the last 72 hours. Anemia Panel: No results  for input(s): VITAMINB12, FOLATE, FERRITIN, TIBC, IRON, RETICCTPCT in the last 72 hours. Urine analysis:    Component Value Date/Time   COLORURINE YELLOW 07/05/2011 2321   APPEARANCEUR CLEAR 07/05/2011 2321   LABSPEC 1.020 07/20/2011 0950   PHURINE 6.0 07/20/2011 0950   GLUCOSEU NEGATIVE 07/20/2011 0950   HGBUR NEGATIVE 07/20/2011 0950   BILIRUBINUR NEGATIVE 07/20/2011 0950   KETONESUR NEGATIVE 07/20/2011 0950   PROTEINUR 30 (A) 07/20/2011 0950   UROBILINOGEN 0.2 07/20/2011 0950   NITRITE NEGATIVE 07/20/2011 0950   LEUKOCYTESUR SMALL (A) 07/20/2011 0950   Sepsis Labs: @LABRCNTIP (procalcitonin:4,lacticidven:4) )No results found for this or any previous visit (from the past 240 hour(s)).   Radiological Exams on Admission: No results found.  EKG: Independently reviewed.  Assessment/Plan Active Problems:   Transaminitis    1. Transaminitis - 1. Possible unintentional acetaminophen overdose given heavy BC goodies powders use over past week (the "purlple pack" for "back and body" that she has been using does indeed appear to have acetaminophen in it). 2. Getting mucomyst for next 24 hours 3. Repeat CMP tomorrow AM 4. Acute viral hepatitis pnl pending 5. Depending on response to mucomyst, may want to look for other causes of transaminitis and her polyarthritis 6. ANA ordered and pending 7. Am going to hold off on ordering other autoimmune hepatitis labs for the moment (anti smooth muscle, anti mitochondrial, etc) 8. If no rapid improvement, consider GI consult 9. Will get [place] RUQ as well to look at liver morphology and ducts 2. Polyarthritis - 1. 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

## 2015-10-21 NOTE — Progress Notes (Signed)
Patient ID: Alice Hernandez, female   DOB: July 31, 1975, 40 y.o.   MRN: [id number]   Pt admitted after midnight. For details, please refer to admission note done 10/21/2015.  40 y.o. female with no significant past medical history. She presented with bilateral feet and knees pain for about a couple of days prior to this admission. She was taking Goody powders for pain control which provided minimal pain relief. On admission, patient was hemodynamically stable but her LFTs were elevated. She was admitted for further evaluation.  Assessment and plan:  Elevated LFTs - Unclear etiology - AST 863, ALT 1220, ALP 172, normal bilirubin - Abdominal ultrasound did not show gallstones or dilated bile ducts - Awaiting acute hepatitis panel - May need GI consultation if LFTs do not trend down  Bilateral lower extremity pain - Unclear etiology, patient claims history of gout in family - We'll try colchicine and see if there is any improvement  Manson Passeylma Devine [facility] [phone]

## 2015-10-21 NOTE — Progress Notes (Signed)
MEDICATION RELATED CONSULT NOTE - INITIAL   Pharmacy Consult for Acetadote Indication: Possible unintentional acetaminophen overdose  No Known Allergies  Patient Measurements: Weight: 161 lb (73 kg)  Vital Signs: Temp: 97.6 F (36.4 C) (10/09 0402) Temp Source: Oral (10/09 0402) BP: 115/75 (10/09 0402) Pulse Rate: 86 (10/09 0402) Intake/Output from previous day: 10/08 0701 - 10/09 0700 In: 1000 [IV Piggyback:1000] Out: -  Intake/Output from this shift: Total I/O In: 1000 [IV Piggyback:1000] Out: -   Labs:  Recent Labs  10/21/15 0050  WBC 4.2  HGB 11.6*  HCT 34.7*  PLT 185  CREATININE 0.68  ALBUMIN 3.2*  PROT 6.3*  AST 863*  ALT 1,220*  ALKPHOS 172*  BILITOT 0.7   CrCl cannot be calculated (Unknown ideal weight.).   Microbiology: No results found for this or any previous visit (from the past 720 hour(s)).  Medical History: Past Medical History:  Diagnosis Date  . No pertinent past medical history     Medications:  Scheduled:  . acetylcysteine  150 mg/kg Intravenous Once  . enoxaparin (LOVENOX) injection  40 mg Subcutaneous Q24H   Infusions:  . acetylcysteine      Assessment:  40 yr old female presents with c/o foot and knee pain.  Reports taking BC powders.  Initial LFTs elevated  Poison Control Center notified by ED NP and Acetadote IV load + infusion x 23 hours ordered  Pharmacy consulted for providing assistance with acetaminophen overdose treatment plan including recommendations from the [facility]  Goal of Therapy:  Acetaminophen level < 10 LFTs WNL  Plan:   Acetadote 150mg /kg IV loading dose x 1 followed by 15 mg/kg/hr x 23 hours  F/U AM CMet  Jeronda Don, Joselyn GlassmanLeann Trefz, PharmD 10/21/2015,5:01 AM

## 2015-10-22 DIAGNOSIS — M2550 Pain in unspecified joint: Secondary | ICD-10-CM

## 2015-10-22 DIAGNOSIS — M79604 Pain in right leg: Secondary | ICD-10-CM

## 2015-10-22 DIAGNOSIS — M79605 Pain in left leg: Secondary | ICD-10-CM

## 2015-10-22 LAB — COMPREHENSIVE METABOLIC PANEL
ALBUMIN: 3.2 g/dL — AB (ref 3.5–5.0)
ALT: 1184 U/L — ABNORMAL HIGH (ref 14–54)
ANION GAP: 7 (ref 5–15)
AST: 702 U/L — ABNORMAL HIGH (ref 15–41)
Alkaline Phosphatase: 151 U/L — ABNORMAL HIGH (ref 38–126)
BILIRUBIN TOTAL: 0.6 mg/dL (ref 0.3–1.2)
BUN: 12 mg/dL (ref 6–20)
CO2: 23 mmol/L (ref 22–32)
Calcium: 8.6 mg/dL — ABNORMAL LOW (ref 8.9–10.3)
Chloride: 110 mmol/L (ref 101–111)
Creatinine, Ser: 0.59 mg/dL (ref 0.44–1.00)
GLUCOSE: 92 mg/dL (ref 65–99)
POTASSIUM: 3.9 mmol/L (ref 3.5–5.1)
Sodium: 140 mmol/L (ref 135–145)
TOTAL PROTEIN: 6.5 g/dL (ref 6.5–8.1)

## 2015-10-22 LAB — PROTIME-INR
INR: 1.29
PROTHROMBIN TIME: 16.2 s — AB (ref 11.4–15.2)

## 2015-10-22 LAB — HIV ANTIBODY (ROUTINE TESTING W REFLEX): HIV Screen 4th Generation wRfx: NONREACTIVE

## 2015-10-22 LAB — SALICYLATE LEVEL

## 2015-10-22 LAB — ANTINUCLEAR ANTIBODIES, IFA: ANTINUCLEAR ANTIBODIES, IFA: NEGATIVE

## 2015-10-22 LAB — HEPATITIS PANEL, ACUTE
HCV Ab: <0.1 s/co ratio (ref 0.0–0.9)
HEP B C IGM: POSITIVE — AB
HEP B S AG: POSITIVE — AB
Hep A IgM: NEGATIVE

## 2015-10-22 LAB — ACETAMINOPHEN LEVEL

## 2015-10-22 MED ORDER — METHOCARBAMOL 500 MG PO TABS
500.0000 mg | ORAL_TABLET | Freq: Three times a day (TID) | ORAL | Status: DC | PRN
  Administered 2015-10-22 – 2015-10-23 (×3): 500 mg via ORAL
  Filled 2015-10-22 (×3): qty 1

## 2015-10-22 NOTE — Consult Note (Signed)
[facility] Gastroenterology Consultation Note  Referring Provider: Dr. Manson Passey [facility]) Primary Care Physician:  No PCP Per Patient Primary Gastroenterologist:  None  Reason for Consultation:  Elevated LFTs  HPI: Alice Hernandez is a 40 y.o. female whom we've been asked to see for elevated LFTs.  For the past couple weeks, she had some painful swelling in both legs, for which she took some BC powder's (containing acetaminophen), unclear amount.  Patient has no abdominal pain, nausea, vomiting, blood in stool, jaundice.  Some dark urine.  No pruritus.  Labs showed markedly elevated LFTs and acute hepatitis panel consistent with acute hepatitis B.  Occasional alcohol.  No IV drug use or recent tattoos.  Recent sexual encounter a couple weeks ago, immediately before onset of her weakness, malaise, and lower extremity swelling symptoms.   Past Medical History:  Diagnosis Date  . Medical history non-contributory   . No pertinent past medical history     Past Surgical History:  Procedure Laterality Date  . LAPAROSCOPY    . WISDOM TOOTH EXTRACTION      Prior to Admission medications   Not on File    Current Facility-Administered Medications  Medication Dose Route Frequency Provider Last Rate Last Dose  . colchicine tablet 0.6 mg  0.6 mg Oral BID Alison Murray, MD   0.6 mg at 10/22/15 1012  . enoxaparin (LOVENOX) injection 40 mg  40 mg Subcutaneous Q24H Hillary Bow, DO   40 mg at 10/22/15 1012  . fentaNYL (SUBLIMAZE) injection 25 mcg  25 mcg Intravenous Q2H PRN Hillary Bow, DO   25 mcg at 10/22/15 0909  . nicotine (NICODERM CQ - dosed in mg/24 hr) patch 7 mg  7 mg Transdermal QHS Leda Gauze, NP   7 mg at 10/21/15 2346    Allergies as of 10/20/2015  . (No Known Allergies)    Family History  Problem Relation Age of Onset  . Other Neg Hx     Social History   Social History  . Marital status: Married    Spouse name: N/A  . Number of children: N/A  . Years of  education: N/A   Occupational History  . Not on file.   Social History Main Topics  . Smoking status: Current Every Day Smoker    Packs/day: 0.25    Years: 23.00    Types: Cigarettes  . Smokeless tobacco: Never Used  . Alcohol use Yes     Comment: occasional  . Drug use:     Types: Marijuana, Cocaine  . Sexual activity: Not on file   Other Topics Concern  . Not on file   Social History Narrative  . No narrative on file    Review of Systems: Positive = bold Gen: Denies any fever, chills, rigors, night sweats, anorexia, fatigue, weakness, malaise, involuntary weight loss, and sleep disorder CV: Denies chest pain, angina, palpitations, syncope, orthopnea, PND, peripheral edema, and claudication. Resp: Denies dyspnea, cough, sputum, wheezing, coughing up blood. GI: Described in detail in HPI.    GU : Denies urinary burning, blood in urine, urinary frequency, urinary hesitancy, nocturnal urination, and urinary incontinence. MS: Denies joint pain or swelling.  Denies muscle weakness, cramps, atrophy.  Derm: Denies rash, itching, oral ulcerations, hives, unhealing ulcers.  Psych: Denies depression, anxiety, memory loss, suicidal ideation, hallucinations,  and confusion. Heme: Denies bruising, bleeding, and enlarged lymph nodes. Neuro:  Denies any headaches, dizziness, paresthesias. Endo:  Denies any problems with DM, thyroid, adrenal function.  Physical Exam: Vital signs in last 24 hours: Temp:  [97.6 F (36.4 C)-98.4 F (36.9 C)] 98.3 F (36.8 C) (10/10 0500) Pulse Rate:  [66-76] 66 (10/10 0500) Resp:  [18] 18 (10/10 0500) BP: (120-133)/(71-77) 120/72 (10/10 0500) SpO2:  [97 %-99 %] 99 % (10/10 0500) Last BM Date: 10/22/15 General:   Alert,  Well-developed, well-nourished, pleasant and cooperative in NAD Head:  Normocephalic and atraumatic. Eyes:  Sclera clear, no icterus.   Conjunctiva pink. Ears:  Normal auditory acuity. Nose:  No deformity, discharge,  or  lesions. Mouth:  No deformity or lesions.  Oropharynx pink & moist. Neck:  Supple; no masses or thyromegaly. Lungs:  Clear throughout to auscultation.   No wheezes, crackles, or rhonchi. No acute distress. Heart:  Regular rate and rhythm; no murmurs, clicks, rubs,  or gallops. Abdomen:  Soft, nontender and nondistended. No masses, hepatosplenomegaly or hernias noted. Normal bowel sounds, without guarding, and without rebound.     Msk:  Symmetrical without gross deformities. Normal posture. Pulses:  Normal pulses noted. Extremities:  Tender and non-pitting edema bilateral lower extremities. Neurologic:  Alert and  oriented x4;  grossly normal neurologically; no encephalopathy Skin:  Intact without significant lesions or rashes. Psych:  Alert and cooperative. Normal mood and affect.   Lab Results:  Recent Labs  10/21/15 0050 10/21/15 0521  WBC 4.2 3.8*  HGB 11.6* 11.7*  HCT 34.7* 35.2*  PLT 185 191   BMET  Recent Labs  10/21/15 0050 10/21/15 0521 10/22/15 0418  NA 136 137 140  K 4.0 4.3 3.9  CL 110 111 110  CO2 21* 20* 23  GLUCOSE 94 156* 92  BUN 18 13 12   CREATININE 0.68 0.59 0.59  CALCIUM 8.4* 7.9* 8.6*   LFT  Recent Labs  10/22/15 0418  PROT 6.5  ALBUMIN 3.2*  AST 702*  ALT 1,184*  ALKPHOS 151*  BILITOT 0.6   PT/INR  Recent Labs  10/21/15 0050 10/22/15 0418  LABPROT 16.7* 16.2*  INR 1.34 1.29    Studies/Results: Koreas Abdomen Limited Ruq  Result Date: 10/21/2015 CLINICAL DATA:  Elevated LFTs. EXAM: US ABDOMEN LIMITED - RIGHT UPPER QUADRANT COMPARISON:  None. FINDINGS: Gallbladder: Physiologically distended. No gallstones or wall thickening visualized. No sonographic Murphy sign noted by sonographer. Common bile duct: Diameter: 3.5 mm, normal. Liver: No focal lesion identified. Within normal limits in parenchymal echogenicity. Normal directional flow in the main portal vein. IMPRESSION: Normal right upper quadrant ultrasound. Electronically Signed   By:  Rubye OaksMelanie  Ehinger M.D.   On: 10/21/2015 05:34   Impression:  1.  Elevated LFTs.  Transaminases ~ 1000.  No evidence of biliary obstruction on recent ultrasound.  Suspect acute hepatitis B.  Alcohol and acetaminophen may contribute, but doubt they are playing any significant role. 2.  Acute hepatitis B.  No evidence of liver failure (no coagulopathy, encephalopathy).  Most likely from recent sexual encounter. 3.  Lower extremity swelling.  Wonder if some systemic inflammatory response to her acute hepatitis B; no obvious evidence of erythema nodosum.  Plan:  1.  Means of transmission of Hepatitis B explained to patient in detail.  Recent sexual encounter seems most likely etiology. 2.  I have advised she contact her recent sexual partner and have them tested for Hepatitis B.  Close household contacts and family members also need to see a physician for Hep B vaccination series (or if very high risk for infection, HBIG). 3.  I have also asked that she avoid sharing combs,  toothbrushes, razors.  She has been advised that this is a reportable condition to the Health Department, and she may be contacted by them in the near future. 4.  Avoid acetaminophen and alcohol until further notice. 5.  Recheck LFTs and PT/INR tomorrow; if improving, and patient has no evidence of encephalopathy or other concerning process at that time, patient can likely be discharged home tomorrow with close outpatient follow-up. 6.  Patient advised, should she be discharged tomorrow, we will need to repeat LFTs in one week; and, if improving, we can protract interval of further LFT follow-up. 7.  Patient advised that vast majority of people with acute hepatitis B resolve on their own; to confirm that, we would need to check chronic Hepatitis B panel as well as Hepatitis B viral load in about 6 months. 8.  Awaiting autoimmune serologies, but doubt this will appreciably alter management. 9.  Would treat her acute hepatitis B  infection supportively, as she has no evidence of liver failure and over 90% of people clear infection on their own. 10. Defer to primary team re: management of her lower extremity swelling. 11. [facility] will follow.     LOS: 1 day   Raksha Wolfgang M  10/22/2015, 12:09 PM  Pager [phone] If no answer or after 5 PM call [phone]

## 2015-10-22 NOTE — Progress Notes (Signed)
MEDICATION RELATED CONSULT NOTE   Pharmacy Consult for Acetadote Indication: Possible unintentional acetaminophen overdose  No Known Allergies  Patient Measurements: Weight: 161 lb (73 kg)  Vital Signs: Temp: 98.3 F (36.8 C) (10/10 0500) Temp Source: Oral (10/10 0500) BP: 120/72 (10/10 0500) Pulse Rate: 66 (10/10 0500) Intake/Output from previous day: 10/09 0701 - 10/10 0700 In: 417.2 [P.O.:50; I.V.:367.2] Out: 2650 [Urine:1050] Intake/Output from this shift: No intake/output data recorded.  Labs:  Recent Labs  10/21/15 0050 10/21/15 0521 10/22/15 0418  WBC 4.2 3.8*  --   HGB 11.6* 11.7*  --   HCT 34.7* 35.2*  --   PLT 185 191  --   CREATININE 0.68 0.59 0.59  ALBUMIN 3.2*  --  3.2*  PROT 6.3*  --  6.5  AST 863*  --  702*  ALT 1,220*  --  1,184*  ALKPHOS 172*  --  151*  BILITOT 0.7  --  0.6   CrCl cannot be calculated (Unknown ideal weight.).   Microbiology: No results found for this or any previous visit (from the past 720 hour(s)).  Medical History: Past Medical History:  Diagnosis Date  . Medical history non-contributory   . No pertinent past medical history     Medications:  Scheduled:  . colchicine  0.6 mg Oral BID  . enoxaparin (LOVENOX) injection  40 mg Subcutaneous Q24H  . nicotine  7 mg Transdermal QHS   Infusions:     Assessment:  40 yr old female presents with c/o foot and knee pain.  Reports taking BC powders.  Initial LFTs elevated  Po[facility] Control Center notified by ED NP and Acetadote IV load + infusion x 23 hours ordered  IV infusion stopped ~2hrs due to physician order  24hr LFTS trending down  Case reviewed with Poison Control Center this morning  Goal of Therapy:  Acetaminophen level < 10 LFTs WNL  Plan:   [facility] to d/c Acetadote this morning.  Dr Elisabeth Pigeonevine paged to make aware.  Medication stopped.   Elson ClanLilliston, Lovene Maret Michelle, PharmD 10/22/2015,8:58 AM

## 2015-10-22 NOTE — Progress Notes (Addendum)
Patient ID: Alice Hernandez, female   DOB: [birth year], 40 y.o.   MRN: [id number]  PROGRESS NOTE    Alice Hernandez  WUJ:[id number] DOB: 08-Jan-1976 DOA: 10/20/2015  PCP: No PCP Per Patient   Brief Narrative:  40 y.o.femalewith no significant past medical history. She presented with bilateral feet and knees pain for about a couple of days prior to this admission. She was taking Goody powders for pain control which provided minimal pain relief. On admission, patient was hemodynamically stable but her LFTs were elevated. She was admitted for further evaluation.   Assessment & Plan:  Elevated LFTs / Acute hepatitis B infection without coma - AST 863, ALT 1220, ALP 172, normal bilirubin - LFT's this am: AST 702, ALT 1184, ALP 151 - Acute hepatitis panel positive for hep B surface antigen  - Appreciate GI consultation and recommendation - Will stop acetadote today (please note started on acetadote per poisson control since admission due to possibility of goody powders toxicity but pt tylenol level WNL on admission).  - Abdominal ultrasound did not show gallstones or dilated bile ducts  Bilateral lower extremity pain / Arthralgia  - Unclear etiology, patient claims history of gout in family - I think her pain especially reports of bone pain is likely arthralgia due to hepatitis - Stop colchicine    DVT prophylaxis: Lovenox subcutaneous Code Status: full code  Family Communication: No family at the bedside Disposition Plan: home once cleared by GI   Consultants:   [facility]  Procedures:   None   Antimicrobials:   None    Subjective: Pt reports bone pain.  Objective: Vitals:   10/21/15 0700 10/21/15 1318 10/21/15 2137 10/22/15 0500  BP: 135/83 133/77 122/71 120/72  Pulse: 60 74 76 66  Resp: 18 18 18 18   Temp: 97.8 F (36.6 C) 98.4 F (36.9 C) 97.6 F (36.4 C) 98.3 F (36.8 C)  TempSrc: Oral Oral Oral Oral  SpO2: 100% 99% 97% 99%  Weight:        Intake/Output  Summary (Last 24 hours) at 10/22/15 1035 Last data filed at 10/22/15 0600  Gross per 24 hour  Intake            417.2 ml  Output             2650 ml  Net          -2232.8 ml   Filed Weights   10/21/15 0406  Weight: 73 kg (161 lb)    Examination:  General exam: Appears calm and comfortable  Respiratory system: Clear to auscultation. Respiratory effort normal. Cardiovascular system: S1 & S2 heard, RRR. No JVD, murmurs, rubs, gallops or clicks. No pedal edema. Gastrointestinal system: Abdomen is nondistended, soft and nontender. No organomegaly or masses felt. Normal bowel sounds heard. Central nervous system: Alert and oriented. No focal neurological deficits. Extremities: Symmetric 5 x 5 power. Skin: No rashes, lesions or ulcers Psychiatry: Judgement and insight appear normal. Mood & affect appropriate.   Data Reviewed: I have personally reviewed following labs and imaging studies  CBC:  Recent Labs Lab 10/21/15 0050 10/21/15 0521  WBC 4.2 3.8*  NEUTROABS 1.8  --   HGB 11.6* 11.7*  HCT 34.7* 35.2*  MCV 88.3 88.7  PLT 185 191   Basic Metabolic Panel:  Recent Labs Lab 10/21/15 0050 10/21/15 0521 10/22/15 0418  NA 136 137 140  K 4.0 4.3 3.9  CL 110 111 110  CO2 21* 20* 23  GLUCOSE 94 156* 92  BUN 18 13 12   CREATININE 0.68 0.59 0.59  CALCIUM 8.4* 7.9* 8.6*   GFR: CrCl cannot be calculated (Unknown ideal weight.). Liver Function Tests:  Recent Labs Lab 10/21/15 0050 10/22/15 0418  AST 863* 702*  ALT 1,220* 1,184*  ALKPHOS 172* 151*  BILITOT 0.7 0.6  PROT 6.3* 6.5  ALBUMIN 3.2* 3.2*   No results for input(s): LIPASE, AMYLASE in the last 168 hours. No results for input(s): AMMONIA in the last 168 hours. Coagulation Profile:  Recent Labs Lab 10/21/15 0050 10/22/15 0418  INR 1.34 1.29   Cardiac Enzymes: No results for input(s): CKTOTAL, CKMB, CKMBINDEX, TROPONINI in the last 168 hours. BNP (last 3 results) No results for input(s): PROBNP in the  last 8760 hours. HbA1C: No results for input(s): HGBA1C in the last 72 hours. CBG: No results for input(s): GLUCAP in the last 168 hours. Lipid Profile: No results for input(s): CHOL, HDL, LDLCALC, TRIG, CHOLHDL, LDLDIRECT in the last 72 hours. Thyroid Function Tests: No results for input(s): TSH, T4TOTAL, FREET4, T3FREE, THYROIDAB in the last 72 hours. Anemia Panel: No results for input(s): VITAMINB12, FOLATE, FERRITIN, TIBC, IRON, RETICCTPCT in the last 72 hours. Urine analysis:    Component Value Date/Time   COLORURINE YELLOW 07/05/2011 2321   APPEARANCEUR CLEAR 07/05/2011 2321   LABSPEC 1.020 07/20/2011 0950   PHURINE 6.0 07/20/2011 0950   GLUCOSEU NEGATIVE 07/20/2011 0950   HGBUR NEGATIVE 07/20/2011 0950   BILIRUBINUR NEGATIVE 07/20/2011 0950   KETONESUR NEGATIVE 07/20/2011 0950   PROTEINUR 30 (A) 07/20/2011 0950   UROBILINOGEN 0.2 07/20/2011 0950   NITRITE NEGATIVE 07/20/2011 0950   LEUKOCYTESUR SMALL (A) 07/20/2011 0950   Sepsis Labs: @LABRCNTIP (procalcitonin:4,lacticidven:4)   )No results found for this or any previous visit (from the past 240 hour(s)).    Radiology Studies: [place] Abdomen Limited Ruq Result Date: 10/9/2017Normal right upper quadrant ultrasound.     Scheduled Meds: . colchicine  0.6 mg Oral BID  . enoxaparin (LOVENOX) injection  40 mg Subcutaneous Q24H  . nicotine  7 mg Transdermal QHS   Continuous Infusions:    LOS: 1 day    Time spent: 25 minutes  Greater than 50% of the time spent on counseling and coordinating the care.   Manson PasseyEVINE, Lauralie Blacksher, MD [facility] Pager [phone]  If 7PM-7AM, please contact night-coverage [email or web address] Password [email or web address] 10/22/2015, 10:35 AM

## 2015-10-22 NOTE — Progress Notes (Signed)
Pt asking for information on Hepatitis B. RN provided the pt with information packet on Hepatitis B and answered questions pertaining to Hepatitis B.  Kearie Mennen W Geraldy Akridge, RN

## 2015-10-22 NOTE — Evaluation (Signed)
Physical Therapy Evaluation Patient Details Name: Alice Hernandez MRN: [id number] DOB: 10-May-1975 Today's Date: 10/22/2015   History of Present Illness  40 y.o. female  admitted with B foot and B knee pain.  She attributed this to being on her feet all the time, she's a waitress.  For the past couple of days she has been taking BC powders with minimal relief.  She had generalized myalgias and chills for 3 days prior to admission. Labs showed elevated LFT. Dx of hepatitis B, ? Gout   Clinical Impression  Pt admitted with above diagnosis. Pt currently with functional limitations due to the deficits listed below (see PT Problem List). Pt ambulated 9890' with RW, distance limited by LLE pain. She has decreased strength/ROM in LLE and reports tingling in L toes.  Pt will benefit from skilled PT to increase their independence and safety with mobility to allow discharge to the venue listed below.       Follow Up Recommendations No PT follow up    Equipment Recommendations  Rolling walker with 5" wheels    Recommendations for Other Services       Precautions / Restrictions Precautions Precautions: Fall Precaution Comments: no h/o falls Restrictions Weight Bearing Restrictions: No      Mobility  Bed Mobility Overal bed mobility: Modified Independent             General bed mobility comments: HOB up, used rail  Transfers Overall transfer level: Needs assistance Equipment used: Rolling walker (2 wheeled) Transfers: Sit to/from Stand Sit to Stand: Min guard         General transfer comment: VCs hand placement  Ambulation/Gait Ambulation/Gait assistance: Supervision Ambulation Distance (Feet): 90 Feet Assistive device: Rolling walker (2 wheeled) Gait Pattern/deviations: Step-to pattern;Antalgic   Gait velocity interpretation: Below normal speed for age/gender General Gait Details: pain LLE limits tolerance, VCs for sequencing  Stairs            Wheelchair  Mobility    Modified Rankin (Stroke Patients Only)       Balance Overall balance assessment: Modified Independent                                           Pertinent Vitals/Pain Pain Assessment: 0-10 Pain Score: 3  Pain Location: both legs, L more than R hip, thigh, shin, ankle; both shoulders Pain Descriptors / Indicators: Aching Pain Intervention(s): Premedicated before session;Monitored during session;Limited activity within patient's tolerance    Home Living Family/patient expects to be discharged to:: Private residence Living Arrangements: Other relatives Available Help at Discharge: Family;Available 24 hours/day         Home Layout: Two level;Bed/bath upstairs Home Equipment: None Additional Comments: lives with brother and sister in law, could stay with friend who doesn't have stairs    Prior Function Level of Independence: Independent               Hand Dominance        Extremity/Trunk Assessment   Upper Extremity Assessment: Overall WFL for tasks assessed           Lower Extremity Assessment: LLE deficits/detail;RLE deficits/detail RLE Deficits / Details: sensation intact to light touch, knee ext -4/5 LLE Deficits / Details: tingling bottom of toes, -3/5 knee ext limited by pain, ankle AROM decreased 50% limited by pain  Cervical / Trunk Assessment: Normal  Communication   Communication: No  difficulties  Cognition Arousal/Alertness: Awake/alert Behavior During Therapy: WFL for tasks assessed/performed Overall Cognitive Status: Within Functional Limits for tasks assessed                      General Comments      Exercises     Assessment/Plan    PT Assessment Patient needs continued PT services  PT Problem List Decreased strength;Decreased range of motion;Decreased activity tolerance;Decreased knowledge of use of DME;Decreased mobility;Pain          PT Treatment Interventions Gait training;DME  instruction;Stair training;Functional mobility training;Therapeutic exercise;Therapeutic activities;Patient/family education    PT Goals (Current goals can be found in the Care Plan section)  Acute Rehab PT Goals 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 PT Goal Formulation: With patient Time For Goal Achievement: 11/05/15 Potential to Achieve Goals: Good    Frequency Min 3X/week   Barriers to discharge        Co-evaluation               End of Session Equipment Utilized During Treatment: Gait belt Activity Tolerance: Patient limited by pain Patient left: in chair;with call bell/phone within reach Nurse Communication: Mobility status         Time: 1029-1106 PT Time Calculation (min) (ACUTE ONLY): 37 min   Charges:   PT Evaluation $PT Eval Moderate Complexity: 1 Procedure PT Treatments $Gait Training: 8-22 mins   PT G Codes:        Tamala Ser 10/22/2015, 11:17 AM [phone]

## 2015-10-23 ENCOUNTER — Inpatient Hospital Stay (HOSPITAL_COMMUNITY): Payer: Medicaid Other

## 2015-10-23 DIAGNOSIS — M79609 Pain in unspecified limb: Secondary | ICD-10-CM

## 2015-10-23 DIAGNOSIS — R740 Nonspecific elevation of levels of transaminase and lactic acid dehydrogenase [LDH]: Secondary | ICD-10-CM

## 2015-10-23 DIAGNOSIS — B169 Acute hepatitis B without delta-agent and without hepatic coma: Secondary | ICD-10-CM

## 2015-10-23 DIAGNOSIS — M791 Myalgia, unspecified site: Secondary | ICD-10-CM

## 2015-10-23 LAB — COMPREHENSIVE METABOLIC PANEL
ALT: 1557 U/L — ABNORMAL HIGH (ref 14–54)
ANION GAP: 8 (ref 5–15)
AST: 1095 U/L — ABNORMAL HIGH (ref 15–41)
Albumin: 3.5 g/dL (ref 3.5–5.0)
Alkaline Phosphatase: 140 U/L — ABNORMAL HIGH (ref 38–126)
BILIRUBIN TOTAL: 0.7 mg/dL (ref 0.3–1.2)
BUN: 11 mg/dL (ref 6–20)
CALCIUM: 8.9 mg/dL (ref 8.9–10.3)
CO2: 24 mmol/L (ref 22–32)
Chloride: 105 mmol/L (ref 101–111)
Creatinine, Ser: 0.65 mg/dL (ref 0.44–1.00)
GFR calc Af Amer: >60 mL/min (ref >60)
Glucose, Bld: 103 mg/dL — ABNORMAL HIGH (ref 65–99)
POTASSIUM: 3.6 mmol/L (ref 3.5–5.1)
Sodium: 137 mmol/L (ref 135–145)
TOTAL PROTEIN: 6.7 g/dL (ref 6.5–8.1)

## 2015-10-23 LAB — PROTIME-INR
INR: 1.21
PROTHROMBIN TIME: 15.3 s — AB (ref 11.4–15.2)

## 2015-10-23 MED ORDER — ONDANSETRON HCL 4 MG/2ML IJ SOLN
4.0000 mg | Freq: Four times a day (QID) | INTRAMUSCULAR | Status: DC | PRN
  Administered 2015-10-23: 4 mg via INTRAVENOUS
  Filled 2015-10-23: qty 2

## 2015-10-23 MED ORDER — MORPHINE SULFATE (PF) 2 MG/ML IV SOLN
2.0000 mg | INTRAVENOUS | Status: DC | PRN
  Administered 2015-10-23: 2 mg via INTRAVENOUS
  Filled 2015-10-23: qty 1

## 2015-10-23 MED ORDER — POTASSIUM CHLORIDE IN NACL 20-0.9 MEQ/L-% IV SOLN
INTRAVENOUS | Status: DC
  Administered 2015-10-23: 17:00:00 via INTRAVENOUS
  Filled 2015-10-23 (×2): qty 1000

## 2015-10-23 MED ORDER — METHOCARBAMOL 500 MG PO TABS
500.0000 mg | ORAL_TABLET | Freq: Three times a day (TID) | ORAL | Status: DC
  Administered 2015-10-23 – 2015-10-24 (×3): 500 mg via ORAL
  Filled 2015-10-23 (×3): qty 1

## 2015-10-23 MED ORDER — FENTANYL CITRATE (PF) 100 MCG/2ML IJ SOLN
25.0000 ug | INTRAMUSCULAR | Status: DC | PRN
  Administered 2015-10-23 – 2015-10-24 (×4): 25 ug via INTRAVENOUS
  Filled 2015-10-23 (×4): qty 2

## 2015-10-23 NOTE — Progress Notes (Signed)
PROGRESS NOTE    Alice Hernandez  ZOX:[id number] DOB: [birth year] DOA: 10/20/2015 PCP: No PCP Per Patient    Brief Narrative: 40 year old female with no significant past medical history presented with generalized weakness, mild DM, bilateral feet and knees pain for a few days. Patient reported taking Goody/BC powders containing acetaminophen, exact amount unclear. Patient with transaminitis likely due to acute hepatitis B infection. GI evaluation ongoing.   Assessment & Plan:   #Acute hepatitis B infection with transaminitis: Liver enzymes are all trending today. Patient continues to feel nausea, generalized weakness and myalgia. GI consult appreciated, recommended conservative management and lab monitoring. -Further evaluation and treatment of acute hepatitis B including antiviral, Mucomyst defer to gastroenterologist. -INR not elevated. -HIV nonreactive -Continue current supportive care. Add IV hydration and [place] when necessary.  #Bilateral leg pain and myalgia likely due to acute hepatitis B infection: Doppler ultrasound of lower extremity negative for DVT. Continue Robaxin. Currently on colchicine, I doubt if this is gout. -Examination of legs and joints unremarkable. No swelling, rashes or tenderness.  Continue PT, OT therapy and supportive care.   DVT prophylaxis: Lovenox subcutaneous Code Status: Full code Family Communication: No family present at bedside Disposition Plan: Likely discharge home in 1-2 days   Consultants:   Gastroenterologist  Procedures: None   Antimicrobials: None  Subjective: Patient was seen and examined at bedside. Patient reported generalized body pain including lower extremity and knees. Feels weak, nauseated and right upper quadrant discomfort. Denied vomiting, fever, chills, chest pain or shortness of breath.   Objective: Vitals:   10/22/15 1419 10/22/15 2154 10/23/15 0532 10/23/15 1454  BP: 133/82 132/90 117/71 116/79  Pulse: 61 74 68  80  Resp: 18 18 20 18   Temp: 98.5 F (36.9 C) 97.5 F (36.4 C) 98.5 F (36.9 C) 97.7 F (36.5 C)  TempSrc: Oral Oral Oral Oral  SpO2: 100% 100% 98% 100%  Weight:        Intake/Output Summary (Last 24 hours) at 10/23/15 1504 Last data filed at 10/23/15 1501  Gross per 24 hour  Intake              702 ml  Output                0 ml  Net              702 ml   Filed Weights   10/21/15 0406  Weight: 73 kg (161 lb)    Examination:  General exam: Ill-looking female not in distress. Respiratory system: Clear to auscultation. Respiratory effort normal. Cardiovascular system: S1 & S2 heard, RRR. No pedal edema. Gastrointestinal system: Abdomen is nondistended, soft and tenderness over right upper quadrant.  Normal bowel sounds heard. Central nervous system: Alert and oriented. No focal neurological deficits. Extremities: Symmetric 5 x 5 power. Skin: No rashes, lesions or ulcers Psychiatry: Judgement and insight appear normal. Mood & affect appropriate.     Data Reviewed: I have personally reviewed following labs and imaging studies  CBC:  Recent Labs Lab 10/21/15 0050 10/21/15 0521  WBC 4.2 3.8*  NEUTROABS 1.8  --   HGB 11.6* 11.7*  HCT 34.7* 35.2*  MCV 88.3 88.7  PLT 185 191   Basic Metabolic Panel:  Recent Labs Lab 10/21/15 0050 10/21/15 0521 10/22/15 0418 10/23/15 0549  NA 136 137 140 137  K 4.0 4.3 3.9 3.6  CL 110 111 110 105  CO2 21* 20* 23 24  GLUCOSE 94 156* 92 103*  BUN 18  13 12 11   CREATININE 0.68 0.59 0.59 0.65  CALCIUM 8.4* 7.9* 8.6* 8.9   GFR: CrCl cannot be calculated (Unknown ideal weight.). Liver Function Tests:  Recent Labs Lab 10/21/15 0050 10/22/15 0418 10/23/15 0549  AST 863* 702* 1,095*  ALT 1,220* 1,184* 1,557*  ALKPHOS 172* 151* 140*  BILITOT 0.7 0.6 0.7  PROT 6.3* 6.5 6.7  ALBUMIN 3.2* 3.2* 3.5   No results for input(s): LIPASE, AMYLASE in the last 168 hours. No results for input(s): AMMONIA in the last 168  hours. Coagulation Profile:  Recent Labs Lab 10/21/15 0050 10/22/15 0418 10/23/15 0549  INR 1.34 1.29 1.21   Cardiac Enzymes: No results for input(s): CKTOTAL, CKMB, CKMBINDEX, TROPONINI in the last 168 hours. BNP (last 3 results) No results for input(s): PROBNP in the last 8760 hours. HbA1C: No results for input(s): HGBA1C in the last 72 hours. CBG: No results for input(s): GLUCAP in the last 168 hours. Lipid Profile: No results for input(s): CHOL, HDL, LDLCALC, TRIG, CHOLHDL, LDLDIRECT in the last 72 hours. Thyroid Function Tests: No results for input(s): TSH, T4TOTAL, FREET4, T3FREE, THYROIDAB in the last 72 hours. Anemia Panel: No results for input(s): VITAMINB12, FOLATE, FERRITIN, TIBC, IRON, RETICCTPCT in the last 72 hours. Sepsis Labs: No results for input(s): PROCALCITON, LATICACIDVEN in the last 168 hours.  No results found for this or any previous visit (from the past 240 hour(s)).       Radiology Studies: No results found.      Scheduled Meds: . colchicine  0.6 mg Oral BID  . enoxaparin (LOVENOX) injection  40 mg Subcutaneous Q24H  . nicotine  7 mg Transdermal QHS   Continuous Infusions:    LOS: 2 days    Time spent: 30 minutes    Shelma Eiben Jaynie Collins, MD [facility] Pager [phone]  If 7PM-7AM, please contact night-coverage [email or web address] Password [email or web address] 10/23/2015, 3:04 PM

## 2015-10-23 NOTE — Progress Notes (Signed)
Subjective: Worsening lower extremity pains. Now having some upper abdominal discomfort.  Objective: Vital signs in last 24 hours: Temp:  [97.5 F (36.4 C)-98.5 F (36.9 C)] 98.5 F (36.9 C) (10/11 0532) Pulse Rate:  [61-74] 68 (10/11 0532) Resp:  [18-20] 20 (10/11 0532) BP: (117-133)/(71-90) 117/71 (10/11 0532) SpO2:  [98 %-100 %] 98 % (10/11 0532) Weight change:  Last BM Date: 10/22/15  PE: GEN: Alert, somewhat uncomfortable-appearing ABD:  Mild epigastric and right upper quadrant tenderness EXT:  Bilateral tender and non-pitting edema bilateral lower extremities NEURO:  Alert, no encephalopathy  Lab Results: CBC    Component Value Date/Time   WBC 3.8 (L) 10/21/2015 0521   RBC 3.97 10/21/2015 0521   HGB 11.7 (L) 10/21/2015 0521   HCT 35.2 (L) 10/21/2015 0521   PLT 191 10/21/2015 0521   MCV 88.7 10/21/2015 0521   MCH 29.5 10/21/2015 0521   MCHC 33.2 10/21/2015 0521   RDW 13.6 10/21/2015 0521   LYMPHSABS 1.6 10/21/2015 0050   MONOABS 0.6 10/21/2015 0050   EOSABS 0.2 10/21/2015 0050   BASOSABS 0.1 10/21/2015 0050   CMP     Component Value Date/Time   NA 137 10/23/2015 0549   K 3.6 10/23/2015 0549   CL 105 10/23/2015 0549   CO2 24 10/23/2015 0549   GLUCOSE 103 (H) 10/23/2015 0549   BUN 11 10/23/2015 0549   CREATININE 0.65 10/23/2015 0549   CALCIUM 8.9 10/23/2015 0549   PROT 6.7 10/23/2015 0549   ALBUMIN 3.5 10/23/2015 0549   AST 1,095 (H) 10/23/2015 0549   ALT 1,557 (H) 10/23/2015 0549   ALKPHOS 140 (H) 10/23/2015 0549   BILITOT 0.7 10/23/2015 0549   GFRNONAA >60 10/23/2015 0549   GFRAA >60 10/23/2015 0549   INR ~ 1.3  Assessment:  1.  Elevated LFTs.  Transaminases slowly uptrending.  Bilirubin normal.  No evidence of biliary obstruction on recent ultrasound.  Suspect acute hepatitis B.  Alcohol and acetaminophen may contribute, but doubt they are playing any primary role. 2.  Acute hepatitis B.  No evidence of liver failure (no coagulopathy,  encephalopathy).  Most likely from recent sexual encounter. 3.  Lower extremity swelling.  Wonder if some systemic inflammatory response to her acute hepatitis B; no obvious evidence of erythema nodosum.  Plan:  1.  I have reviewed case with Dr. Julieta GuttingHayashi (hepatologist [facility]  He has advised we watch patient another day, hopefully to see a downtrend in transaminases. 2.  He is comfortable with holding off on mucomyst for the time-being, but if transaminases and/or INR uptrend, we might have to consider reinstituting the mucomyst and completing the entire course of this therapy. 3.  Would hold off on medical therapy for Hepatitis B at this time, but would obtain social work consult to see if there will be any difficulties with obtaining entecavir, in the off chance that her LFTs continue or circumstances change (development of coagulopathy, encephalopathy, etc) which might change our mind regarding pursuing medical therapy. 4.  Obtain delta antibody and HBV DNA viral load. 5.  [facility] will follow.   Alice Hernandez,Alice Hernandez 10/23/2015, 9:38 AM   Pager [phone] If no answer or after 5 PM call [phone]

## 2015-10-23 NOTE — Progress Notes (Signed)
*  PRELIMINARY RESULTS* Vascular Ultrasound Lower extremity venous duplex has been completed.  Preliminary findings: No evidence of DVT or baker's cyst.  Farrel DemarkJill Eunice, RDMS, RVT  10/23/2015, 1:30 PM

## 2015-10-24 LAB — COMPREHENSIVE METABOLIC PANEL
ALBUMIN: 3.4 g/dL — AB (ref 3.5–5.0)
ALK PHOS: 179 U/L — AB (ref 38–126)
ALT: 1872 U/L — ABNORMAL HIGH (ref 14–54)
AST: 1436 U/L — AB (ref 15–41)
Anion gap: 6 (ref 5–15)
BILIRUBIN TOTAL: 0.6 mg/dL (ref 0.3–1.2)
BUN: 11 mg/dL (ref 6–20)
CO2: 22 mmol/L (ref 22–32)
Calcium: 8.8 mg/dL — ABNORMAL LOW (ref 8.9–10.3)
Chloride: 108 mmol/L (ref 101–111)
Creatinine, Ser: 0.6 mg/dL (ref 0.44–1.00)
GFR calc Af Amer: >60 mL/min (ref >60)
GFR calc non Af Amer: >60 mL/min (ref >60)
GLUCOSE: 86 mg/dL (ref 65–99)
POTASSIUM: 4.2 mmol/L (ref 3.5–5.1)
Sodium: 136 mmol/L (ref 135–145)
TOTAL PROTEIN: 6.6 g/dL (ref 6.5–8.1)

## 2015-10-24 LAB — PROTIME-INR
INR: 1.18
Prothrombin Time: 15.1 seconds (ref 11.4–15.2)

## 2015-10-24 MED ORDER — ENTECAVIR 0.5 MG PO TABS: 0.5000 mg | ORAL_TABLET | Freq: Every day | ORAL | 0 refills | Status: DC

## 2015-10-24 MED ORDER — ONDANSETRON HCL 4 MG PO TABS: 4.0000 mg | ORAL_TABLET | Freq: Three times a day (TID) | ORAL | 0 refills | Status: DC | PRN

## 2015-10-24 MED ORDER — METHOCARBAMOL 500 MG PO TABS: 500.0000 mg | ORAL_TABLET | Freq: Three times a day (TID) | ORAL | 0 refills | Status: DC | PRN

## 2015-10-24 MED ORDER — TENOFOVIR DISOPROXIL FUMARATE 300 MG PO TABS: 300.0000 mg | ORAL_TABLET | Freq: Every day | ORAL | 0 refills | Status: AC

## 2015-10-24 MED FILL — VIREAD 300 MG TABLET: 300 | 30 days supply | Qty: 30 | Fill #0

## 2015-10-24 NOTE — Discharge Summary (Signed)
Physician Discharge Summary  Alice Bookmanaisha Hernandez ZOX:[id number] DOB: 12-11-1975 DOA: 10/20/2015  PCP: No PCP Per Patient  Admit date: 10/20/2015 Discharge date: 10/24/2015  Admitted From: Home  Disposition:  Home  Recommendations for Outpatient Follow-up:  1. Follow up with PCP in 1-2 weeks 2. Please obtain CMP/CBC in one week 3. Please follow up on the following pending results: hepatitis viral load with Dr Dulce Sellarutlaw.  Home Health:no Equipment/Devices:no  Discharge Condition:stable CODE STATUS:full Diet recommendation: heart healthy.  Brief/Interim Summary: 40 year old female with no significant past medical history presented with generalized weakness, mild DM, bilateral feet and knees pain for a few days. Patient reported taking Goody/BC powders containing acetaminophen, exact amount unclear. Patient with transaminitis likely due to acute hepatitis B infection.  #Acute hepatitis B infection with transaminitis: Evaluated by gastroenterologist. I discussed with Dr. Dulce Sellarutlaw in detail today. Patient has liver enzyme trending up which will likely peak and decrease within next couple of days. INR is not elevated and patient is not encephalopathic. Clinically improving. As per GI, plan to discharge home with oral tenofovir. I also discussed with pharmacist to arrange medication before discharge from the hospital. Patient already has an appointment with gastroenterologist on coming Monday, October 16 for further lab and clinical evaluation. Patient has minimal nausea but denied vomiting or abdominal pain today. She is able to tolerate diet and ambulating well in the hospital. She has generalized weakness and myalgia which is improving slowly. Hepatitis B viral load and other serology will be followed up by GI in the clinic. Patient is clinically stable on discharge.  GI do not think that the liver disease is caused by acetaminophen toxicity. Therefore no need for Mucomyst.  #Bilateral leg pain and  myalgia likely due to acute hepatitis B infection: Doppler ultrasound of lower extremity negative for DVT. Continue Robaxin.  -Examination of legs and joints unremarkable. No swelling, rashes or tenderness.  I discussed with gastroenterologist, pharmacist and case manager about the discharge plan. I also discussed with the patient at bedside regarding discharge plan of care and follow-ups.  Discharge Diagnoses:  Active Problems:   Transaminitis   Viral hepatitis B acute   Myalgia    Discharge Instructions  Discharge Instructions    Call MD for:  difficulty breathing, headache or visual disturbances    Complete by:  As directed    Call MD for:  hives    Complete by:  As directed    Call MD for:  persistant dizziness or light-headedness    Complete by:  As directed    Call MD for:  persistant nausea and vomiting    Complete by:  As directed    Call MD for:  temperature >100.4    Complete by:  As directed    Diet - low sodium heart healthy    Complete by:  As directed    Discharge instructions    Complete by:  As directed    Please follow up with GI on Monday and follow up with your PCP in 1-2 weeks.   Increase activity slowly    Complete by:  As directed        Medication List    TAKE these medications   methocarbamol 500 MG tablet Commonly known as:  ROBAXIN Take 1 tablet (500 mg total) by mouth every 8 (eight) hours as needed for muscle spasms.   ondansetron 4 MG tablet Commonly known as:  ZOFRAN Take 1 tablet (4 mg total) by mouth every 8 (eight) hours as needed  for nausea or vomiting.   tenofovir 300 MG tablet Commonly known as:  VIREAD Take 1 tablet (300 mg total) by mouth daily.      Follow-up Information    Freddy Jaksch, MD. Go on 10/28/2015.   Specialty:  Gastroenterology Why:  at 10 AM. Contact information: [address] [place] [address] [phone]          Allergies  Allergen Reactions  . Morphine And Related Nausea  And Vomiting    Consultations: -GI   Procedures/Studies: US Abdomen Limited Ruq  Result Date: 10/21/2015 CLINICAL DATA:  Elevated LFTs. EXAM: US ABDOMEN LIMITED - RIGHT UPPER QUADRANT COMPARISON:  None. FINDINGS: Gallbladder: Physiologically distended. No gallstones or wall thickening visualized. No sonographic Murphy sign noted by sonographer. Common bile duct: Diameter: 3.5 mm, normal. Liver: No focal lesion identified. Within normal limits in parenchymal echogenicity. Normal directional flow in the main portal vein. IMPRESSION: Normal right upper quadrant ultrasound. Electronically Signed   By: Rubye Oaks M.D.   On: 10/21/2015 05:34      Subjective: Patient was seen and examined at bedside. Patient reported generalized weakness and myalgia which is gradually improving. He still has mild nausea but denied vomiting, abdominal pain or diarrhea. No fever, chills, chest pain or shortness of breath. She is able to tolerate diet. Ambulating well.    Discharge Exam: Vitals:   10/23/15 2021 10/24/15 0603  BP: 128/89 (!) 118/96  Pulse: 81 60  Resp: 18 17  Temp: 98.9 F (37.2 C) 98.8 F (37.1 C)   Vitals:   10/23/15 0532 10/23/15 1454 10/23/15 2021 10/24/15 0603  BP: 117/71 116/79 128/89 (!) 118/96  Pulse: 68 80 81 60  Resp: 20 18 18 17   Temp: 98.5 F (36.9 C) 97.7 F (36.5 C) 98.9 F (37.2 C) 98.8 F (37.1 C)  TempSrc: Oral Oral Oral Oral  SpO2: 98% 100% 99% 100%  Weight:        General: Pt is alert, awake, not in acute distress Cardiovascular: RRR, S1/S2 +, no rubs, no gallops Respiratory: CTA bilaterally, no wheezing, no rhonchi Abdominal: Soft, NT, ND, bowel sounds + Extremities: no edema, no cyanosis, no asterixis    The results of significant diagnostics from this hospitalization (including imaging, microbiology, ancillary and laboratory) are listed below for reference.     Microbiology: No results found for this or any previous visit (from the past 240  hour(s)).   Labs: BNP (last 3 results) No results for input(s): BNP in the last 8760 hours. Basic Metabolic Panel:  Recent Labs Lab 10/21/15 0050 10/21/15 0521 10/22/15 0418 10/23/15 0549 10/24/15 0532  NA 136 137 140 137 136  K 4.0 4.3 3.9 3.6 4.2  CL 110 111 110 105 108  CO2 21* 20* 23 24 22   GLUCOSE 94 156* 92 103* 86  BUN 18 13 12 11 11   CREATININE 0.68 0.59 0.59 0.65 0.60  CALCIUM 8.4* 7.9* 8.6* 8.9 8.8*   Liver Function Tests:  Recent Labs Lab 10/21/15 0050 10/22/15 0418 10/23/15 0549 10/24/15 0532  AST 863* 702* 1,095* 1,436*  ALT 1,220* 1,184* 1,557* 1,872*  ALKPHOS 172* 151* 140* 179*  BILITOT 0.7 0.6 0.7 0.6  PROT 6.3* 6.5 6.7 6.6  ALBUMIN 3.2* 3.2* 3.5 3.4*   No results for input(s): LIPASE, AMYLASE in the last 168 hours. No results for input(s): AMMONIA in the last 168 hours. CBC:  Recent Labs Lab 10/21/15 0050 10/21/15 0521  WBC 4.2 3.8*  NEUTROABS 1.8  --  HGB 11.6* 11.7*  HCT 34.7* 35.2*  MCV 88.3 88.7  PLT 185 191   Cardiac Enzymes: No results for input(s): CKTOTAL, CKMB, CKMBINDEX, TROPONINI in the last 168 hours. BNP: Invalid input(s): POCBNP CBG: No results for input(s): GLUCAP in the last 168 hours. D-Dimer No results for input(s): DDIMER in the last 72 hours. Hgb A1c No results for input(s): HGBA1C in the last 72 hours. Lipid Profile No results for input(s): CHOL, HDL, LDLCALC, TRIG, CHOLHDL, LDLDIRECT in the last 72 hours. Thyroid function studies No results for input(s): TSH, T4TOTAL, T3FREE, THYROIDAB in the last 72 hours.  Invalid input(s): FREET3 Anemia work up No results for input(s): VITAMINB12, FOLATE, FERRITIN, TIBC, IRON, RETICCTPCT in the last 72 hours. Urinalysis    Component Value Date/Time   COLORURINE YELLOW 07/05/2011 2321   APPEARANCEUR CLEAR 07/05/2011 2321   LABSPEC 1.020 07/20/2011 0950   PHURINE 6.0 07/20/2011 0950   GLUCOSEU NEGATIVE 07/20/2011 0950   HGBUR NEGATIVE 07/20/2011 0950    BILIRUBINUR NEGATIVE 07/20/2011 0950   KETONESUR NEGATIVE 07/20/2011 0950   PROTEINUR 30 (A) 07/20/2011 0950   UROBILINOGEN 0.2 07/20/2011 0950   NITRITE NEGATIVE 07/20/2011 0950   LEUKOCYTESUR SMALL (A) 07/20/2011 0950   Sepsis Labs Invalid input(s): PROCALCITONIN,  WBC,  LACTICIDVEN Microbiology No results found for this or any previous visit (from the past 240 hour(s)).   Time coordinating discharge: Over 30 minutes  SIGNED:   Maxie Barb, MD  [facility] 10/24/2015, 12:53 PM Pager   If 7PM-7AM, please contact night-coverage [email or web address] Password [email or web address]

## 2015-10-24 NOTE — Progress Notes (Signed)
Pt left the unit in stable condition, transported home by her family.

## 2015-10-24 NOTE — Progress Notes (Addendum)
Discharge instructions given to pt, verbalized understanding. Awaiting family for transportation home. 

## 2015-10-24 NOTE — Progress Notes (Signed)
Subjective: Diffuse musculoskeletal discomfort. Wants to go home.  Objective: Vital signs in last 24 hours: Temp:  [97.7 F (36.5 C)-98.9 F (37.2 C)] 98.8 F (37.1 C) (10/12 0603) Pulse Rate:  [60-81] 60 (10/12 0603) Resp:  [17-18] 17 (10/12 0603) BP: (116-128)/(79-96) 118/96 (10/12 0603) SpO2:  [99 %-100 %] 100 % (10/12 0603) Weight change:  Last BM Date: 10/23/15  PE: GEN:  Alert, NAD ABD:  Soft, non-tender  Lab Results:  INR 1.18  CBC    Component Value Date/Time   WBC 3.8 (L) 10/21/2015 0521   RBC 3.97 10/21/2015 0521   HGB 11.7 (L) 10/21/2015 0521   HCT 35.2 (L) 10/21/2015 0521   PLT 191 10/21/2015 0521   MCV 88.7 10/21/2015 0521   MCH 29.5 10/21/2015 0521   MCHC 33.2 10/21/2015 0521   RDW 13.6 10/21/2015 0521   LYMPHSABS 1.6 10/21/2015 0050   MONOABS 0.6 10/21/2015 0050   EOSABS 0.2 10/21/2015 0050   BASOSABS 0.1 10/21/2015 0050   CMP     Component Value Date/Time   NA 136 10/24/2015 0532   K 4.2 10/24/2015 0532   CL 108 10/24/2015 0532   CO2 22 10/24/2015 0532   GLUCOSE 86 10/24/2015 0532   BUN 11 10/24/2015 0532   CREATININE 0.60 10/24/2015 0532   CALCIUM 8.8 (L) 10/24/2015 0532   PROT 6.6 10/24/2015 0532   ALBUMIN 3.4 (L) 10/24/2015 0532   AST 1,436 (H) 10/24/2015 0532   ALT 1,872 (H) 10/24/2015 0532   ALKPHOS 179 (H) 10/24/2015 0532   BILITOT 0.6 10/24/2015 0532   GFRNONAA >60 10/24/2015 0532   GFRAA >60 10/24/2015 0532   Assessment:  1.  Acute hepatitis B.  No evidence of liver failure, though transaminases are increasing. 2.  Elevated LFTs, from #1 above, likely that LFTs have not yet peaked.  Again, no evidence of liver failure. 3.  Diffuse joint pains and swelling.  Likely systemic inflammatory response to her acute hepatitis B.  Plan:  1.  Patient's intake of acetaminophen was minimal...just a few doses...therefore I don't think there is any reasonable chance her current issue is at all related to acetaminophen toxicity.  No need  for mucomyst. 2.  I [facility].  He is ok with her going home today, so long as she's not coagulopathy or encephalopathic, with thought that her transaminases will likely peak and decreased within the next couple days.   3.  Will need to obtain either entecavir (0.5 mg po qd) or tenofovir (300 mg po qd) pre-discharge (she needs to have at least 2 weeks of medication on her person before she is discharged).  Total duration of therapy anticipated 2-3 months. 4.  Check HBV viral load.  Assuming we can get her medication for her, she can be discharged home today and follow-[facility] in [facility] for labs on Monday (CMP, PT/INR).  Would continue entecavir or tenofovir until she has seroconverted (HepBsAg negative, HepBsAb positive) two consecutive tests 4 weeks apart. 5.  Will discuss with [facility] team.   Freddy JakschUTLAW,Deaysia Grigoryan M 10/24/2015, 8:47 AM   Pager [phone] If no answer or after 5 PM call [phone]

## 2015-10-24 NOTE — Progress Notes (Signed)
Pharmacy Note Re: Hep B Medication  [facility] they have V[place]ad 300mg  available & [place].  [facility] so can be delivered to patient's room once prescription is ready.   Plan discussed with Dr Ronalee BeltsBhandari.    Junita PushMichelle Adonias Demore, PharmD, BCPS Pager: [phone] 10/24/2015@11 :57 AM

## 2015-10-25 LAB — HEPATITIS B E ANTIGEN: HEP B E AG: POSITIVE — AB

## 2015-10-25 LAB — HEPATITIS B E ANTIBODY: HEP B E AB: NEGATIVE

## 2015-10-28 ENCOUNTER — Emergency Department (HOSPITAL_COMMUNITY)
Admission: EM | Admit: 2015-10-28 | Discharge: 2015-10-29 | Disposition: A | Discharge status: Home / Self Care | Payer: Medicaid Other | Attending: Emergency Medicine | Admitting: Emergency Medicine

## 2015-10-28 ENCOUNTER — Encounter: Payer: Self-pay | Admitting: Emergency Medicine

## 2015-10-28 DIAGNOSIS — M25562 Pain in left knee: Secondary | ICD-10-CM | POA: Diagnosis not present

## 2015-10-28 DIAGNOSIS — M791 Myalgia, unspecified site: Secondary | ICD-10-CM

## 2015-10-28 DIAGNOSIS — Z79899 Other long term (current) drug therapy: Secondary | ICD-10-CM | POA: Diagnosis not present

## 2015-10-28 DIAGNOSIS — R52 Pain, unspecified: Secondary | ICD-10-CM

## 2015-10-28 DIAGNOSIS — M62838 Other muscle spasm: Secondary | ICD-10-CM

## 2015-10-28 DIAGNOSIS — K759 Inflammatory liver disease, unspecified: Secondary | ICD-10-CM | POA: Diagnosis not present

## 2015-10-28 DIAGNOSIS — F17210 Nicotine dependence, cigarettes, uncomplicated: Secondary | ICD-10-CM | POA: Insufficient documentation

## 2015-10-28 HISTORY — DX: Unspecified viral hepatitis B without hepatic coma: B19.10

## 2015-10-28 MED ORDER — OXYCODONE HCL 5 MG PO TABS
5.0000 mg | ORAL_TABLET | Freq: Once | ORAL | Status: AC
  Administered 2015-10-28: 5 mg via ORAL
  Filled 2015-10-28: qty 1

## 2015-10-28 MED ORDER — ONDANSETRON 4 MG PO TBDP
4.0000 mg | ORAL_TABLET | Freq: Once | ORAL | Status: AC
  Administered 2015-10-28: 4 mg via ORAL
  Filled 2015-10-28: qty 1

## 2015-10-28 NOTE — ED Triage Notes (Signed)
Pt states she was here several days ago for generalized body aches and was dx with hepatitis B. States it comes and goes but she has run out of her muscle relaxers. Alert and oriented.

## 2015-10-28 NOTE — ED Notes (Signed)
Patient continuously calls the call bell. Besides this Clinical research associatewriter, a NT, and case manager has been to bedside. Pt continues to threaten she is going to take her BC powders. Ilene, RN has been notified of patients complaining.

## 2015-10-28 NOTE — Progress Notes (Signed)
Patient listed as having Medicaid  Insurance without a pcp.  Pcp listed on patient's insurance card is [facility] at [facility].  EDCM spoke to patient.  She reports, "I don't know who my doctor is!  I am in sop much pain I'm about to flip out!"  [facility].  No further EDCM needs at this time.

## 2015-10-28 NOTE — ED Provider Notes (Signed)
[facility]-EMERGENCY DEPT Provider Note   CSN: [id number] Arrival date & time: 10/28/15  1516     History   Chief Complaint Chief Complaint  Patient presents with  . Generalized Body Aches    HPI Alice Bookmanaisha Eberlein is a 40 y.o. female.  HPI 40 year old female who presents to the ED with left knee pain and myalgias for 1 month who was seen recently 2 weeks ago for the same diagnosed with hepatitis B and placed on Viread. She was given Robaxin for muscle aches which has provided minimal relief. Patient also taken Goody powders for the pain. Patient reports that she's been having back spasms as well. She also endorses fatigue. Denies any fevers, chills, nausea, vomiting, abdominal pain, chest pain, shortness of breath.  Past Medical History:  Diagnosis Date  . Hepatitis B   . Medical history non-contributory   . No pertinent past medical history     Patient Active Problem List   Diagnosis Date Noted  . Viral hepatitis B acute   . Myalgia   . Transaminitis 10/21/2015  . Supervision of high-risk pregnancy 07/20/2011  . Undesired fertility 07/20/2011  . Cocaine abuse complicating pregnancy 07/09/2011    Past Surgical History:  Procedure Laterality Date  . LAPAROSCOPY    . WISDOM TOOTH EXTRACTION      OB History    Gravida Para Term Preterm AB Living   4 2 2   2 2    SAB TAB Ectopic Multiple Live Births     2     1       Home Medications    Prior to Admission medications   Medication Sig Start Date End Date Taking? Authorizing Provider  ondansetron (ZOFRAN) 4 MG tablet Take 1 tablet (4 mg total) by mouth every 8 (eight) hours as needed for nausea or vomiting. 10/24/15  Yes Dron Jaynie CollinsPrasad Bhandari, MD  tenofovir (VIREAD) 300 MG tablet Take 1 tablet (300 mg total) by mouth daily. 10/24/15 11/23/15 Yes Willis ModenaWilliam Outlaw, MD  methocarbamol (ROBAXIN) 500 MG tablet Take 1 tablet (500 mg total) by mouth every 8 (eight) hours as needed for muscle spasms. 10/29/15 11/05/15  Nira ConnPedro Eduardo  Cardama, MD    Family History Family History  Problem Relation Age of Onset  . Other Neg Hx     Social History Social History  Substance Use Topics  . Smoking status: Current Every Day Smoker    Packs/day: 0.25    Years: 23.00    Types: Cigarettes  . Smokeless tobacco: Never Used  . Alcohol use Yes     Comment: occasional     Allergies   Morphine and related   Review of Systems Review of Systems Ten systems are reviewed and are negative for acute change except as noted in the HPI   Physical Exam Updated Vital Signs BP 106/62 (BP Location: Left Arm)   Pulse 72   Temp 98.4 F (36.9 C) (Oral)   Resp 18   Ht 5\' 3"  (1.6 m)   Wt 161 lb (73 kg)   SpO2 98%   BMI 28.52 kg/m   Physical Exam  Constitutional: She is oriented to person, place, and time. She appears well-developed and well-nourished. No distress.  HENT:  Head: Normocephalic and atraumatic.  Nose: Nose normal.  Eyes: Conjunctivae and EOM are normal. Pupils are equal, round, and reactive to light. Right eye exhibits no discharge. Left eye exhibits no discharge. No scleral icterus.  Neck: Normal range of motion. Neck supple.  Cardiovascular: Normal  rate and regular rhythm.  Exam reveals no gallop and no friction rub.   No murmur heard. Pulmonary/Chest: Effort normal and breath sounds normal. No stridor. No respiratory distress. She has no rales.  Abdominal: Soft. She exhibits no distension. There is no tenderness.  Musculoskeletal: She exhibits no edema.       Left knee: She exhibits no swelling, no effusion and no erythema. Tenderness found.       Cervical back: She exhibits tenderness. She exhibits no bony tenderness.       Back:  Neurological: She is alert and oriented to person, place, and time.  Skin: Skin is warm and dry. No rash noted. She is not diaphoretic. No erythema.  Psychiatric: She has a normal mood and affect.  Vitals reviewed.     ED Treatments / Results  Labs (all labs ordered are  listed, but only abnormal results are displayed) Labs Reviewed  COMPREHENSIVE METABOLIC PANEL - Abnormal; Notable for the following:       Result Value   Glucose, Bld 104 (*)    AST 1,544 (*)    ALT 2,219 (*)    Alkaline Phosphatase 196 (*)    All other components within normal limits  PHOSPHORUS  CBC WITH DIFFERENTIAL/PLATELET  SEDIMENTATION RATE  CK  C-REACTIVE PROTEIN    EKG  EKG Interpretation None       Radiology Dg Knee Complete 4 Views Left  Result Date: 10/29/2015 CLINICAL DATA:  Chronic diffuse left knee pain and difficulty bearing weight. Initial encounter. EXAM: LEFT KNEE - COMPLETE 4+ VIEW COMPARISON:  None. FINDINGS: There is no evidence of fracture or dislocation. The joint spaces are preserved. No significant degenerative change is seen; the patellofemoral joint is grossly unremarkable in appearance. Trace knee joint fluid remains within normal limits. The visualized soft tissues are normal in appearance. IMPRESSION: No evidence of fracture or dislocation. If the patient's symptoms persist, MRI could be considered further evaluation, to assess for internal derangement. Electronically Signed   By: Roanna Raider M.D.   On: 10/29/2015 00:48    Procedures Procedures (including critical care time)  ULTRASOUND LIMITED MUSCULOSKELETAL:  Indication: left knee pain Linear probe used to evaluate area of interest in two planes. Findings:  Mild effusion of left and right knee Performed by: Dr Eudelia Bunch Images saved electronically  CPT code:  Lower extremity [phone]  Medications Ordered in ED Medications  ondansetron (ZOFRAN-ODT) disintegrating tablet 4 mg (4 mg Oral Given 10/28/15 2357)  oxyCODONE (Oxy IR/ROXICODONE) immediate release tablet 5 mg (5 mg Oral Given 10/28/15 2357)     Initial Impression / Assessment and Plan / ED Course  I have reviewed the triage vital signs and the nursing notes.  Pertinent labs & imaging results that were available during my  care of the patient were reviewed by me and considered in my medical decision making (see chart for details).  Clinical Course    1. Myalgias With muscle spasms. Screening labs reassuring w/o electrolyte derangements, or elevated CK. Likely secondary to acute Hep B. Given several topical, non-narcotic, non-NSAID, or non-acetaminophen options for symptom management.   2. Left knee pain. Plain film with minimal effusion. No acute injuries. POCUS without significant effusion when compared to contralateral side. Low suspicion for septic arthritis. Low suspicion for DVT.   The patient is safe for discharge with strict return precautions.  Final Clinical Impressions(s) / ED Diagnoses   Final diagnoses:  Myalgia  Muscle spasm  Acute pain of left knee  Hepatitis  Disposition: Discharge  Condition: Good  I have discussed the results, Dx and Tx plan with the patient who expressed understanding and agree(s) with the plan. Discharge instructions discussed at great length. The patient was given strict return precautions who verbalized understanding of the instructions. No further questions at time of discharge.    Discharge Medication List as of 10/29/2015  1:47 AM      Follow Up: [facility] [address] [place] [address] [phone] Call  For help establishing care with a care provider      Nira Conn, MD 10/29/15 [phone]

## 2015-10-28 NOTE — ED Notes (Signed)
Patient called out complaining of pain and she needed pain medication. Informed patient she was waiting on a provider before giving pan medication. Pt has [facility] that [facility] even though she is aware she does not need to take.

## 2015-10-28 NOTE — ED Notes (Signed)
Pt reported that she was still in pain and that she took her own BC powders. Dr.Cardama aware of pt pain and she is currently awaiting his evaluation.

## 2015-10-28 NOTE — ED Notes (Signed)
Writer proceeded to obtain blood work, pt sts she's got anxiety, she's in pain, and that and started cursing at Clinical research associatewriter, Clinical research associatewriter informed not to curse, pt went back into the covers and refused for Clinical research associatewriter to draw blood work.

## 2015-10-29 ENCOUNTER — Emergency Department (HOSPITAL_COMMUNITY): Payer: Medicaid Other

## 2015-10-29 LAB — COMPREHENSIVE METABOLIC PANEL
ALBUMIN: 4 g/dL (ref 3.5–5.0)
ALK PHOS: 196 U/L — AB (ref 38–126)
ALT: 2219 U/L — ABNORMAL HIGH (ref 14–54)
ANION GAP: 9 (ref 5–15)
AST: 1544 U/L — ABNORMAL HIGH (ref 15–41)
BILIRUBIN TOTAL: 0.5 mg/dL (ref 0.3–1.2)
BUN: 18 mg/dL (ref 6–20)
CALCIUM: 9.3 mg/dL (ref 8.9–10.3)
CO2: 24 mmol/L (ref 22–32)
Chloride: 109 mmol/L (ref 101–111)
Creatinine, Ser: 0.8 mg/dL (ref 0.44–1.00)
GFR calc non Af Amer: >60 mL/min (ref >60)
GLUCOSE: 104 mg/dL — AB (ref 65–99)
POTASSIUM: 5 mmol/L (ref 3.5–5.1)
SODIUM: 142 mmol/L (ref 135–145)
TOTAL PROTEIN: 7.4 g/dL (ref 6.5–8.1)

## 2015-10-29 LAB — CBC WITH DIFFERENTIAL/PLATELET
BASOS PCT: 2 %
Basophils Absolute: 0.1 10*3/uL (ref 0.0–0.1)
EOS ABS: 0.3 10*3/uL (ref 0.0–0.7)
Eosinophils Relative: 4 %
HEMATOCRIT: 38.6 % (ref 36.0–46.0)
HEMOGLOBIN: 12.9 g/dL (ref 12.0–15.0)
Lymphocytes Relative: 42 %
Lymphs Abs: 2.8 10*3/uL (ref 0.7–4.0)
MCH: 29.7 pg (ref 26.0–34.0)
MCHC: 33.4 g/dL (ref 30.0–36.0)
MCV: 88.9 fL (ref 78.0–100.0)
MONOS PCT: 10 %
Monocytes Absolute: 0.7 10*3/uL (ref 0.1–1.0)
NEUTROS ABS: 2.8 10*3/uL (ref 1.7–7.7)
NEUTROS PCT: 42 %
Platelets: 376 10*3/uL (ref 150–400)
RBC: 4.34 MIL/uL (ref 3.87–5.11)
RDW: 14.1 % (ref 11.5–15.5)
WBC: 6.7 10*3/uL (ref 4.0–10.5)

## 2015-10-29 LAB — SEDIMENTATION RATE: SED RATE: 12 mm/h (ref 0–22)

## 2015-10-29 LAB — PHOSPHORUS: Phosphorus: 3.7 mg/dL (ref 2.5–4.6)

## 2015-10-29 LAB — CK: CK TOTAL: 87 U/L (ref 38–234)

## 2015-10-29 LAB — C-REACTIVE PROTEIN

## 2015-10-29 MED ORDER — LIDOCAINE HCL 1 % IJ SOLN
INTRAMUSCULAR | Status: AC
  Filled 2015-10-29: qty 20

## 2015-10-29 MED ORDER — METHOCARBAMOL 500 MG PO TABS: 500.0000 mg | ORAL_TABLET | Freq: Three times a day (TID) | ORAL | 0 refills | Status: AC | PRN

## 2015-10-29 NOTE — ED Notes (Signed)
Patient is requesting to speak with provider. Provider has been made aware and awaiting his presence.

## 2015-10-29 NOTE — ED Notes (Signed)
Pt in radiology 

## 2015-11-02 ENCOUNTER — Telehealth (HOSPITAL_BASED_OUTPATIENT_CLINIC_OR_DEPARTMENT_OTHER): Payer: Self-pay

## 2015-11-05 LAB — MISC LABCORP TEST (SEND OUT): LABCORP TEST CODE: 551722

## 2016-01-23 ENCOUNTER — Other Ambulatory Visit: Payer: Self-pay | Admitting: Gastroenterology

## 2016-01-23 ENCOUNTER — Ambulatory Visit
Admission: RE | Admit: 2016-01-23 | Discharge: 2016-01-23 | Disposition: A | Discharge status: Home / Self Care | Payer: Medicaid Other | Source: Ambulatory Visit | Attending: Gastroenterology | Admitting: Gastroenterology

## 2016-01-23 DIAGNOSIS — R140 Abdominal distension (gaseous): Secondary | ICD-10-CM

## 2016-01-24 ENCOUNTER — Other Ambulatory Visit: Payer: Self-pay | Admitting: Gastroenterology

## 2016-01-24 DIAGNOSIS — R1084 Generalized abdominal pain: Secondary | ICD-10-CM

## 2016-01-24 DIAGNOSIS — R110 Nausea: Secondary | ICD-10-CM

## 2016-01-24 DIAGNOSIS — R140 Abdominal distension (gaseous): Secondary | ICD-10-CM

## 2016-01-24 DIAGNOSIS — R1110 Vomiting, unspecified: Secondary | ICD-10-CM

## 2016-01-24 DIAGNOSIS — R6881 Early satiety: Secondary | ICD-10-CM

## 2016-01-27 ENCOUNTER — Ambulatory Visit
Admission: RE | Admit: 2016-01-27 | Discharge: 2016-01-27 | Disposition: A | Discharge status: Home / Self Care | Payer: Medicaid Other | Source: Ambulatory Visit | Attending: Gastroenterology | Admitting: Gastroenterology

## 2016-01-27 DIAGNOSIS — R1110 Vomiting, unspecified: Secondary | ICD-10-CM

## 2016-01-27 DIAGNOSIS — R140 Abdominal distension (gaseous): Secondary | ICD-10-CM

## 2016-01-27 DIAGNOSIS — R1084 Generalized abdominal pain: Secondary | ICD-10-CM

## 2016-01-27 DIAGNOSIS — R110 Nausea: Secondary | ICD-10-CM

## 2016-01-27 DIAGNOSIS — R6881 Early satiety: Secondary | ICD-10-CM

## 2016-01-27 MED ORDER — IOPAMIDOL (ISOVUE-300) INJECTION 61%
100.0000 mL | Freq: Once | INTRAVENOUS | Status: AC | PRN
  Administered 2016-01-27: 100 mL via INTRAVENOUS

## 2016-02-03 ENCOUNTER — Other Ambulatory Visit: Payer: Self-pay | Admitting: Nurse Practitioner

## 2016-02-03 ENCOUNTER — Emergency Department (HOSPITAL_COMMUNITY)
Admission: EM | Admit: 2016-02-03 | Discharge: 2016-02-04 | Disposition: A | Discharge status: Home / Self Care | Payer: Medicaid Other | Attending: Emergency Medicine | Admitting: Emergency Medicine

## 2016-02-03 ENCOUNTER — Encounter (HOSPITAL_COMMUNITY): Payer: Self-pay | Admitting: Emergency Medicine

## 2016-02-03 DIAGNOSIS — Z5321 Procedure and treatment not carried out due to patient leaving prior to being seen by health care provider: Secondary | ICD-10-CM | POA: Insufficient documentation

## 2016-02-03 DIAGNOSIS — R079 Chest pain, unspecified: Secondary | ICD-10-CM | POA: Insufficient documentation

## 2016-02-03 DIAGNOSIS — R102 Pelvic and perineal pain: Secondary | ICD-10-CM

## 2016-02-03 NOTE — ED Triage Notes (Signed)
Pt sts pain in chest and back x 3 days with some fever

## 2016-02-03 NOTE — ED Notes (Signed)
Patient called in main ED waiting room area with no response 

## 2016-02-06 ENCOUNTER — Ambulatory Visit
Admission: RE | Admit: 2016-02-06 | Discharge: 2016-02-06 | Disposition: A | Discharge status: Home / Self Care | Payer: Medicaid Other | Source: Ambulatory Visit | Attending: Nurse Practitioner | Admitting: Nurse Practitioner

## 2016-02-06 ENCOUNTER — Other Ambulatory Visit: Payer: Medicaid Other

## 2016-02-06 DIAGNOSIS — R102 Pelvic and perineal pain: Secondary | ICD-10-CM

## 2018-02-14 ENCOUNTER — Telehealth: Payer: Self-pay | Admitting: Surgery

## 2018-04-16 ENCOUNTER — Encounter (HOSPITAL_COMMUNITY): Payer: Self-pay | Admitting: Emergency Medicine

## 2018-04-16 ENCOUNTER — Emergency Department (HOSPITAL_COMMUNITY)
Admission: EM | Admit: 2018-04-16 | Discharge: 2018-04-16 | Disposition: A | Discharge status: Home / Self Care | Payer: Medicaid Other | Attending: Emergency Medicine | Admitting: Emergency Medicine

## 2018-04-16 ENCOUNTER — Emergency Department (HOSPITAL_COMMUNITY): Payer: Medicaid Other

## 2018-04-16 ENCOUNTER — Other Ambulatory Visit: Payer: Self-pay

## 2018-04-16 DIAGNOSIS — F17210 Nicotine dependence, cigarettes, uncomplicated: Secondary | ICD-10-CM | POA: Insufficient documentation

## 2018-04-16 DIAGNOSIS — R6889 Other general symptoms and signs: Secondary | ICD-10-CM

## 2018-04-16 DIAGNOSIS — B9789 Other viral agents as the cause of diseases classified elsewhere: Secondary | ICD-10-CM

## 2018-04-16 DIAGNOSIS — R509 Fever, unspecified: Secondary | ICD-10-CM | POA: Diagnosis present

## 2018-04-16 DIAGNOSIS — J069 Acute upper respiratory infection, unspecified: Secondary | ICD-10-CM | POA: Diagnosis not present

## 2018-04-16 DIAGNOSIS — Z20822 Contact with and (suspected) exposure to covid-19: Secondary | ICD-10-CM

## 2018-04-16 MED ORDER — ACETAMINOPHEN 500 MG PO TABS
1000.0000 mg | ORAL_TABLET | Freq: Once | ORAL | Status: DC
  Filled 2018-04-16: qty 2

## 2018-04-16 NOTE — Discharge Instructions (Signed)
Person Under Monitoring Name: Alice Hernandez  Location: [address] [place] [address]   Infection Prevention Recommendations for Individuals Confirmed to have, or Being Evaluated for, 2019 Novel Coronavirus (COVID-19) Infection Who Receive Care at Home  Individuals who are confirmed to have, or are being evaluated for, COVID-19 should follow the prevention steps below until a healthcare provider or local or state health department says they can return to normal activities.  Stay home except to get medical care You should restrict activities outside your home, except for getting medical care. Do not go to work, school, or public areas, and do not use public transportation or taxis.  Call ahead before visiting your doctor Before your medical appointment, call the healthcare provider and tell them that you have, or are being evaluated for, COVID-19 infection. This will help the healthcare providers office take steps to keep other people from getting infected. Ask your healthcare provider to call the local or state health department.  Monitor your symptoms Seek prompt medical attention if your illness is worsening (e.g., difficulty breathing). Before going to your medical appointment, call the healthcare provider and tell them that you have, or are being evaluated for, COVID-19 infection. Ask your healthcare provider to call the local or state health department.  Wear a facemask You should wear a facemask that covers your nose and mouth when you are in the same room with other people and when you visit a healthcare provider. People who live with or visit you should also wear a facemask while they are in the same room with you.  Separate yourself from other people in your home As much as possible, you should stay in a different room from other people in your home. Also, you should use a separate bathroom, if available.  Avoid sharing household items You should not share  dishes, drinking glasses, cups, eating utensils, towels, bedding, or other items with other people in your home. After using these items, you should wash them thoroughly with soap and water.  Cover your coughs and sneezes Cover your mouth and nose with a tissue when you cough or sneeze, or you can cough or sneeze into your sleeve. Throw used tissues in a lined trash can, and immediately wash your hands with soap and water for at least 20 seconds or use an alcohol-based hand rub.  Wash your [employer] your hands often and thoroughly with soap and water for at least 20 seconds. You can use an alcohol-based hand sanitizer if soap and water are not available and if your hands are not visibly dirty. Avoid touching your eyes, nose, and mouth with unwashed hands.   Prevention Steps for Caregivers and Household Members of Individuals Confirmed to have, or Being Evaluated for, COVID-19 Infection Being Cared for in the Home  If you live with, or provide care at home for, a person confirmed to have, or being evaluated for, COVID-19 infection please follow these guidelines to prevent infection:  Follow healthcare providers instructions Make sure that you understand and can help the patient follow any healthcare provider instructions for all care.  Provide for the patients basic needs You should help the patient with basic needs in the home and provide support for getting groceries, prescriptions, and other personal needs.  Monitor the patients symptoms If they are getting sicker, call his or her medical provider and tell them that the patient has, or is being evaluated for, COVID-19 infection. This will help the healthcare providers office take  steps to keep other people from getting infected. Ask the healthcare provider to call the local or state health department.  Limit the number of people who have contact with the patient If possible, have only one caregiver for the patient. Other  household members should stay in another home or place of residence. If this is not possible, they should stay in another room, or be separated from the patient as much as possible. Use a separate bathroom, if available. Restrict visitors who do not have an essential need to be in the home.  Keep older adults, very young children, and other sick people away from the patient Keep older adults, very young children, and those who have compromised immune systems or chronic health conditions away from the patient. This includes people with chronic heart, lung, or kidney conditions, diabetes, and cancer.  Ensure good ventilation Make sure that shared spaces in the home have good air flow, such as from an air conditioner or an opened window, weather permitting.  Wash your hands often Wash your hands often and thoroughly with soap and water for at least 20 seconds. You can use an alcohol based hand sanitizer if soap and water are not available and if your hands are not visibly dirty. Avoid touching your eyes, nose, and mouth with unwashed hands. Use disposable paper towels to dry your hands. If not available, use dedicated cloth towels and replace them when they become wet.  Wear a facemask and gloves Wear a disposable facemask at all times in the room and gloves when you touch or have contact with the patients blood, body fluids, and/or secretions or excretions, such as sweat, saliva, sputum, nasal mucus, vomit, urine, or feces.  Ensure the mask fits over your nose and mouth tightly, and do not touch it during use. Throw out disposable facemasks and gloves after using them. Do not reuse. Wash your hands immediately after removing your facemask and gloves. If your personal clothing becomes contaminated, carefully remove clothing and launder. Wash your hands after handling contaminated clothing. Place all used disposable facemasks, gloves, and other waste in a lined container before disposing them with  other household waste. Remove gloves and wash your hands immediately after handling these items.  Do not share dishes, glasses, or other household items with the patient Avoid sharing household items. You should not share dishes, drinking glasses, cups, eating utensils, towels, bedding, or other items with a patient who is confirmed to have, or being evaluated for, COVID-19 infection. After the person uses these items, you should wash them thoroughly with soap and water.  Wash laundry thoroughly Immediately remove and wash clothes or bedding that have blood, body fluids, and/or secretions or excretions, such as sweat, saliva, sputum, nasal mucus, vomit, urine, or feces, on them. Wear gloves when handling laundry from the patient. Read and follow directions on labels of laundry or clothing items and detergent. In general, wash and dry with the warmest temperatures recommended on the label.  Clean all areas the individual has used often Clean all touchable surfaces, such as counters, tabletops, doorknobs, bathroom fixtures, toilets, phones, keyboards, tablets, and bedside tables, every day. Also, clean any surfaces that may have blood, body fluids, and/or secretions or excretions on them. Wear gloves when cleaning surfaces the patient has come in contact with. Use a diluted bleach solution (e.g., dilute bleach with 1 part bleach and 10 parts water) or a household disinfectant with a label that says EPA-registered for coronaviruses. To make a bleach solution  at home, add 1 tablespoon of bleach to 1 quart (4 cups) of water. For a larger supply, add  cup of bleach to 1 gallon (16 cups) of water. Read labels of cleaning products and follow recommendations provided on product labels. Labels contain instructions for safe and effective use of the cleaning product including precautions you should take when applying the product, such as wearing gloves or eye protection and making sure you have good ventilation  during use of the product. Remove gloves and wash hands immediately after cleaning.  Monitor yourself for signs and symptoms of illness Caregivers and household members are considered close contacts, should monitor their health, and will be asked to limit movement outside of the home to the extent possible. Follow the monitoring steps for close contacts listed on the symptom monitoring form.   ? If you have additional questions, contact your local health department or call the epidemiologist on call at [phone] (available 24/7). ? This guidance is subject to change. For the most up-to-date guidance from [facility], please refer to their website: [email or web address]
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
## 2018-04-16 NOTE — ED Notes (Signed)
Pt. Refused her Tylenol. Pt. Stating using curse words.PA notified of procedure.

## 2018-04-16 NOTE — ED Triage Notes (Signed)
Pt. Stated, Alice Hernandez had a fever 2 days ago. Pt doesn't want to take her Tylenol. Sometimes I gasp for a breath. Been to [place] and no protective devices.

## 2018-04-16 NOTE — ED Notes (Addendum)
Went into patient room patient stated that she had a fever then I checked patient fever then patient started arguing with me stated that I was rude. I was never rude to the patient just went in introduce myself and ask patient if I could take her vitals patient stated that they took her temp out front, and it was normal patient started cursing me out, it really hurt my feelings and all I was doing was patient care.

## 2018-04-16 NOTE — ED Notes (Signed)
Took 3 Motrin at 800

## 2018-07-15 ENCOUNTER — Emergency Department (HOSPITAL_COMMUNITY)
Admission: EM | Admit: 2018-07-15 | Discharge: 2018-07-15 | Disposition: A | Discharge status: Home / Self Care | Payer: Medicaid Other | Attending: Emergency Medicine | Admitting: Emergency Medicine

## 2018-07-15 ENCOUNTER — Encounter (HOSPITAL_COMMUNITY): Payer: Self-pay | Admitting: Emergency Medicine

## 2018-07-15 ENCOUNTER — Emergency Department (HOSPITAL_COMMUNITY): Payer: Medicaid Other

## 2018-07-15 DIAGNOSIS — S298XXA Other specified injuries of thorax, initial encounter: Secondary | ICD-10-CM | POA: Diagnosis not present

## 2018-07-15 DIAGNOSIS — Y939 Activity, unspecified: Secondary | ICD-10-CM | POA: Diagnosis not present

## 2018-07-15 DIAGNOSIS — S80812A Abrasion, left lower leg, initial encounter: Secondary | ICD-10-CM | POA: Diagnosis not present

## 2018-07-15 DIAGNOSIS — S43402A Unspecified sprain of left shoulder joint, initial encounter: Secondary | ICD-10-CM | POA: Insufficient documentation

## 2018-07-15 DIAGNOSIS — Y999 Unspecified external cause status: Secondary | ICD-10-CM | POA: Insufficient documentation

## 2018-07-15 DIAGNOSIS — T07XXXA Unspecified multiple injuries, initial encounter: Secondary | ICD-10-CM | POA: Diagnosis present

## 2018-07-15 DIAGNOSIS — S060X0A Concussion without loss of consciousness, initial encounter: Secondary | ICD-10-CM | POA: Diagnosis not present

## 2018-07-15 DIAGNOSIS — E860 Dehydration: Secondary | ICD-10-CM

## 2018-07-15 DIAGNOSIS — S8392XA Sprain of unspecified site of left knee, initial encounter: Secondary | ICD-10-CM | POA: Insufficient documentation

## 2018-07-15 DIAGNOSIS — F17210 Nicotine dependence, cigarettes, uncomplicated: Secondary | ICD-10-CM | POA: Diagnosis not present

## 2018-07-15 DIAGNOSIS — Y929 Unspecified place or not applicable: Secondary | ICD-10-CM | POA: Diagnosis not present

## 2018-07-15 DIAGNOSIS — Z23 Encounter for immunization: Secondary | ICD-10-CM | POA: Insufficient documentation

## 2018-07-15 LAB — COMPREHENSIVE METABOLIC PANEL
ALT: 16 U/L (ref 0–44)
AST: 39 U/L (ref 15–41)
Albumin: 4.5 g/dL (ref 3.5–5.0)
Alkaline Phosphatase: 43 U/L (ref 38–126)
Anion gap: 12 (ref 5–15)
BUN: 8 mg/dL (ref 6–20)
CO2: 19 mmol/L — ABNORMAL LOW (ref 22–32)
Calcium: 9.4 mg/dL (ref 8.9–10.3)
Chloride: 107 mmol/L (ref 98–111)
Creatinine, Ser: 1.08 mg/dL — ABNORMAL HIGH (ref 0.44–1.00)
GFR calc Af Amer: >60 mL/min (ref >60)
GFR calc non Af Amer: >60 mL/min (ref >60)
Glucose, Bld: 103 mg/dL — ABNORMAL HIGH (ref 70–99)
Potassium: 3 mmol/L — ABNORMAL LOW (ref 3.5–5.1)
Sodium: 138 mmol/L (ref 135–145)
Total Bilirubin: 0.5 mg/dL (ref 0.3–1.2)
Total Protein: 7.2 g/dL (ref 6.5–8.1)

## 2018-07-15 LAB — CBC
HCT: 40.9 % (ref 36.0–46.0)
Hemoglobin: 13.2 g/dL (ref 12.0–15.0)
MCH: 28.9 pg (ref 26.0–34.0)
MCHC: 32.3 g/dL (ref 30.0–36.0)
MCV: 89.7 fL (ref 80.0–100.0)
Platelets: 271 10*3/uL (ref 150–400)
RBC: 4.56 MIL/uL (ref 3.87–5.11)
RDW: 13 % (ref 11.5–15.5)
WBC: 7.5 10*3/uL (ref 4.0–10.5)
nRBC: 0 % (ref 0.0–0.2)

## 2018-07-15 LAB — SAMPLE TO BLOOD BANK

## 2018-07-15 LAB — ETHANOL: Alcohol, Ethyl (B): <10 mg/dL (ref <10)

## 2018-07-15 LAB — I-STAT BETA HCG BLOOD, ED (MC, WL, AP ONLY): I-stat hCG, quantitative: <5 m[IU]/mL (ref <5)

## 2018-07-15 LAB — CDS SEROLOGY

## 2018-07-15 LAB — PROTIME-INR
INR: 1.2 (ref 0.8–1.2)
Prothrombin Time: 15.5 seconds — ABNORMAL HIGH (ref 11.4–15.2)

## 2018-07-15 MED ORDER — TETANUS-DIPHTH-ACELL PERTUSSIS 5-2.5-18.5 LF-MCG/0.5 IM SUSP
0.5000 mL | Freq: Once | INTRAMUSCULAR | Status: AC
  Administered 2018-07-15: 0.5 mL via INTRAMUSCULAR
  Filled 2018-07-15: qty 0.5

## 2018-07-15 MED ORDER — HYDROCODONE-ACETAMINOPHEN 5-325 MG PO TABS: 1.0000 | ORAL_TABLET | Freq: Four times a day (QID) | ORAL | 0 refills | Status: DC | PRN

## 2018-07-15 MED ORDER — HYDROMORPHONE HCL 1 MG/ML IJ SOLN
1.0000 mg | Freq: Once | INTRAMUSCULAR | Status: AC
  Administered 2018-07-15: 1 mg via INTRAVENOUS
  Filled 2018-07-15: qty 1

## 2018-07-15 MED ORDER — LORAZEPAM 2 MG/ML IJ SOLN
1.0000 mg | Freq: Once | INTRAMUSCULAR | Status: AC
  Administered 2018-07-15: 04:00:00 1 mg via INTRAVENOUS
  Filled 2018-07-15: qty 1

## 2018-07-15 MED ORDER — POTASSIUM CHLORIDE CRYS ER 20 MEQ PO TBCR
40.0000 meq | EXTENDED_RELEASE_TABLET | Freq: Once | ORAL | Status: AC
  Administered 2018-07-15: 40 meq via ORAL
  Filled 2018-07-15: qty 2

## 2018-07-15 MED ORDER — SODIUM CHLORIDE 0.9 % IV BOLUS (SEPSIS)
1000.0000 mL | Freq: Once | INTRAVENOUS | Status: AC
  Administered 2018-07-15: 1000 mL via INTRAVENOUS

## 2018-07-15 NOTE — ED Notes (Signed)
Patient transported to CT 
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
## 2018-07-15 NOTE — ED Notes (Signed)
Wound cleaned with saline, bacitracin applied. Wrapped with gauze/coban
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
## 2018-07-18 ENCOUNTER — Encounter (HOSPITAL_COMMUNITY): Payer: Self-pay | Admitting: Emergency Medicine

## 2018-07-22 ENCOUNTER — Ambulatory Visit (HOSPITAL_COMMUNITY)
Admission: EM | Admit: 2018-07-22 | Discharge: 2018-07-22 | Disposition: A | Discharge status: Home / Self Care | Payer: Medicaid Other | Attending: Internal Medicine | Admitting: Internal Medicine

## 2018-07-22 ENCOUNTER — Encounter (HOSPITAL_COMMUNITY): Payer: Self-pay

## 2018-07-22 ENCOUNTER — Other Ambulatory Visit: Payer: Self-pay

## 2018-07-22 ENCOUNTER — Emergency Department (HOSPITAL_COMMUNITY)
Admission: EM | Admit: 2018-07-22 | Discharge: 2018-07-22 | Discharge status: Against Medical Advice | Payer: Medicaid Other | Attending: Emergency Medicine | Admitting: Emergency Medicine

## 2018-07-22 DIAGNOSIS — M7918 Myalgia, other site: Secondary | ICD-10-CM

## 2018-07-22 DIAGNOSIS — Z5321 Procedure and treatment not carried out due to patient leaving prior to being seen by health care provider: Secondary | ICD-10-CM | POA: Insufficient documentation

## 2018-07-22 DIAGNOSIS — G8911 Acute pain due to trauma: Secondary | ICD-10-CM | POA: Diagnosis present

## 2018-07-22 MED ORDER — OXYCODONE-ACETAMINOPHEN 5-325 MG PO TABS
1.0000 | ORAL_TABLET | ORAL | Status: DC | PRN
  Administered 2018-07-22: 1 via ORAL
  Filled 2018-07-22: qty 1

## 2018-07-22 MED ORDER — ONDANSETRON 4 MG PO TBDP
4.0000 mg | ORAL_TABLET | Freq: Once | ORAL | Status: AC
  Administered 2018-07-22: 18:00:00 4 mg via ORAL
  Filled 2018-07-22: qty 1

## 2018-07-22 MED ORDER — HYDROCODONE-ACETAMINOPHEN 5-325 MG PO TABS: 1.0000 | ORAL_TABLET | Freq: Four times a day (QID) | ORAL | 0 refills | Status: DC | PRN

## 2018-07-22 NOTE — ED Notes (Signed)
Pt came out in hallway limping and started pushing on lobby doors. Staff advised to sit in wheelchair so she did not fall. She refused same and kept walking. Pt would not say if she was leaving or not. Pt seen limping out to waiting room.

## 2018-07-22 NOTE — ED Notes (Signed)
Pt tearful coming back to room. Pt brought back in wheelchair. This tech actually brought the patient in from outside when she checked in. Pt was walking outside before I got her a wheelchair. Asked pt to move from wheelchair to chair in room. Pt stated "I can't move" this tech told patient "You were walking outside before I got your a wheelchair cause I'm the one that brought you in so let's try to move to the chair. Pt moved from wheelchair to chair with no assistance. Pt also used both arms to move from the wheelchair to the chair in room.
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
## 2018-07-22 NOTE — ED Notes (Signed)
Pt became irate upon discharge, pt was angry because she did not receive the dosage or amount of pain killers she wanted. Pt stated she was going to drive herself next door to the ED to be re evaluated and try to receive more for pain. I attempted to wheel the patient out in a wheelchair and she became even more angry and hobbled out and made a scene in the waiting area, yelling and stating that we were kicking her out and not helping her.

## 2018-07-22 NOTE — ED Notes (Signed)
Pt came out of room and went towards exit doors. We asked patient several times if she was leaving and she never gave [place] an answer. Chris EMT went up to patient with a wheelchair and asked if she wanted it. Pt didn't answer and kept walking out into lobby.
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
## 2018-07-22 NOTE — ED Notes (Signed)
Pt in chair, crying. Pt upset stating "y'all just left me in here". Attempted to comfort patient by repositioning. Pt continues to yell, stating due to severe pain.

## 2018-07-22 NOTE — ED Notes (Signed)
Pt leaving the room, refusing a wheelchair. Pt leaving ED walking into lobby, ignoring staff trying to assist her. Refused wheelchair.

## 2018-07-25 ENCOUNTER — Other Ambulatory Visit: Payer: Self-pay | Admitting: Chiropractic Medicine

## 2018-07-25 DIAGNOSIS — M25562 Pain in left knee: Secondary | ICD-10-CM

## 2018-07-27 ENCOUNTER — Encounter (HOSPITAL_BASED_OUTPATIENT_CLINIC_OR_DEPARTMENT_OTHER): Payer: Self-pay | Admitting: *Deleted

## 2018-07-27 ENCOUNTER — Emergency Department (HOSPITAL_BASED_OUTPATIENT_CLINIC_OR_DEPARTMENT_OTHER)
Admission: EM | Admit: 2018-07-27 | Discharge: 2018-07-27 | Disposition: A | Discharge status: Home / Self Care | Payer: Medicaid Other | Attending: Emergency Medicine | Admitting: Emergency Medicine

## 2018-07-27 ENCOUNTER — Other Ambulatory Visit: Payer: Self-pay

## 2018-07-27 DIAGNOSIS — M25561 Pain in right knee: Secondary | ICD-10-CM | POA: Insufficient documentation

## 2018-07-27 DIAGNOSIS — Z5321 Procedure and treatment not carried out due to patient leaving prior to being seen by health care provider: Secondary | ICD-10-CM | POA: Diagnosis not present

## 2018-07-27 DIAGNOSIS — M25562 Pain in left knee: Secondary | ICD-10-CM | POA: Insufficient documentation

## 2018-07-27 NOTE — ED Notes (Signed)
Pt seen walking out of ed
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
## 2018-07-27 NOTE — ED Notes (Addendum)
Pt c/o of knee pain wanting pain meds  Explained to pt that we would see her asap  Pt states that sitting is causing pain top increase  Pt placed in rm 13 so she can lay down  Pt stated that it would be 2 hours before seen  Told pt I did not said that, that see would be seen soon

## 2018-07-28 ENCOUNTER — Other Ambulatory Visit: Payer: Medicaid Other

## 2018-08-31 ENCOUNTER — Ambulatory Visit: Payer: Medicaid Other | Attending: Orthopaedic Surgery | Admitting: Physical Therapy
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
## 2023-02-03 ENCOUNTER — Telehealth (HOSPITAL_COMMUNITY): Payer: Self-pay

## 2023-02-03 NOTE — Telephone Encounter (Signed)
PT called requesting information on out patient or in patient services. While speaking with the PT the PT expressed that they are having suicidal thoughts - I informed the PT that they needed to go downstairs to Urgent care immediately - The PT stated that she just had a virtual appointment with someone at [facility] and that they told her the same thing. The PT then asked if downstairs was open on Friday - I informed the PT that they are open 24/7 - I tried getting more information from the PT but she hung up. Tried calling the PT back and just got VM

## 2023-04-03 ENCOUNTER — Inpatient Hospital Stay (HOSPITAL_COMMUNITY)
Admission: EM | Admit: 2023-04-03 | Discharge: 2023-04-07 | DRG: 565 | Disposition: A | Discharge status: Home / Self Care | Payer: Self-pay | Attending: Internal Medicine | Admitting: Internal Medicine

## 2023-04-03 ENCOUNTER — Emergency Department (HOSPITAL_COMMUNITY): Payer: Self-pay

## 2023-04-03 ENCOUNTER — Other Ambulatory Visit: Payer: Self-pay

## 2023-04-03 DIAGNOSIS — D72829 Elevated white blood cell count, unspecified: Secondary | ICD-10-CM | POA: Diagnosis present

## 2023-04-03 DIAGNOSIS — R609 Edema, unspecified: Secondary | ICD-10-CM | POA: Diagnosis present

## 2023-04-03 DIAGNOSIS — S0083XA Contusion of other part of head, initial encounter: Secondary | ICD-10-CM | POA: Diagnosis present

## 2023-04-03 DIAGNOSIS — F419 Anxiety disorder, unspecified: Secondary | ICD-10-CM | POA: Diagnosis present

## 2023-04-03 DIAGNOSIS — N179 Acute kidney failure, unspecified: Principal | ICD-10-CM | POA: Diagnosis present

## 2023-04-03 DIAGNOSIS — S80811A Abrasion, right lower leg, initial encounter: Secondary | ICD-10-CM | POA: Diagnosis present

## 2023-04-03 DIAGNOSIS — F4310 Post-traumatic stress disorder, unspecified: Secondary | ICD-10-CM | POA: Diagnosis present

## 2023-04-03 DIAGNOSIS — M130 Polyarthritis, unspecified: Secondary | ICD-10-CM | POA: Diagnosis present

## 2023-04-03 DIAGNOSIS — Z59819 Housing instability, housed unspecified: Secondary | ICD-10-CM

## 2023-04-03 DIAGNOSIS — I10 Essential (primary) hypertension: Secondary | ICD-10-CM | POA: Diagnosis present

## 2023-04-03 DIAGNOSIS — Z791 Long term (current) use of non-steroidal anti-inflammatories (NSAID): Secondary | ICD-10-CM

## 2023-04-03 DIAGNOSIS — J439 Emphysema, unspecified: Secondary | ICD-10-CM | POA: Diagnosis present

## 2023-04-03 DIAGNOSIS — Z91410 Personal history of adult physical and sexual abuse: Secondary | ICD-10-CM

## 2023-04-03 DIAGNOSIS — T796XXA Traumatic ischemia of muscle, initial encounter: Principal | ICD-10-CM

## 2023-04-03 DIAGNOSIS — M25511 Pain in right shoulder: Secondary | ICD-10-CM | POA: Diagnosis present

## 2023-04-03 DIAGNOSIS — Z8619 Personal history of other infectious and parasitic diseases: Secondary | ICD-10-CM

## 2023-04-03 DIAGNOSIS — F1090 Alcohol use, unspecified, uncomplicated: Secondary | ICD-10-CM | POA: Diagnosis present

## 2023-04-03 DIAGNOSIS — R1310 Dysphagia, unspecified: Secondary | ICD-10-CM | POA: Diagnosis present

## 2023-04-03 DIAGNOSIS — H538 Other visual disturbances: Secondary | ICD-10-CM | POA: Diagnosis present

## 2023-04-03 DIAGNOSIS — Z885 Allergy status to narcotic agent status: Secondary | ICD-10-CM

## 2023-04-03 DIAGNOSIS — G8929 Other chronic pain: Secondary | ICD-10-CM | POA: Diagnosis present

## 2023-04-03 DIAGNOSIS — S92811A Other fracture of right foot, initial encounter for closed fracture: Secondary | ICD-10-CM

## 2023-04-03 DIAGNOSIS — Z973 Presence of spectacles and contact lenses: Secondary | ICD-10-CM

## 2023-04-03 DIAGNOSIS — E872 Acidosis, unspecified: Secondary | ICD-10-CM | POA: Diagnosis present

## 2023-04-03 DIAGNOSIS — R748 Abnormal levels of other serum enzymes: Secondary | ICD-10-CM | POA: Diagnosis present

## 2023-04-03 DIAGNOSIS — E871 Hypo-osmolality and hyponatremia: Secondary | ICD-10-CM | POA: Diagnosis present

## 2023-04-03 DIAGNOSIS — Y9259 Other trade areas as the place of occurrence of the external cause: Secondary | ICD-10-CM

## 2023-04-03 DIAGNOSIS — S63501A Unspecified sprain of right wrist, initial encounter: Secondary | ICD-10-CM | POA: Diagnosis present

## 2023-04-03 DIAGNOSIS — R7989 Other specified abnormal findings of blood chemistry: Secondary | ICD-10-CM | POA: Diagnosis present

## 2023-04-03 DIAGNOSIS — K5900 Constipation, unspecified: Secondary | ICD-10-CM | POA: Diagnosis present

## 2023-04-03 DIAGNOSIS — M7989 Other specified soft tissue disorders: Secondary | ICD-10-CM | POA: Diagnosis present

## 2023-04-03 DIAGNOSIS — M79606 Pain in leg, unspecified: Secondary | ICD-10-CM | POA: Diagnosis present

## 2023-04-03 DIAGNOSIS — F259 Schizoaffective disorder, unspecified: Secondary | ICD-10-CM | POA: Diagnosis present

## 2023-04-03 DIAGNOSIS — R821 Myoglobinuria: Secondary | ICD-10-CM | POA: Diagnosis present

## 2023-04-03 DIAGNOSIS — F1490 Cocaine use, unspecified, uncomplicated: Secondary | ICD-10-CM | POA: Diagnosis present

## 2023-04-03 DIAGNOSIS — Z79899 Other long term (current) drug therapy: Secondary | ICD-10-CM

## 2023-04-03 DIAGNOSIS — R300 Dysuria: Secondary | ICD-10-CM | POA: Diagnosis present

## 2023-04-03 DIAGNOSIS — S92354A Nondisplaced fracture of fifth metatarsal bone, right foot, initial encounter for closed fracture: Secondary | ICD-10-CM | POA: Diagnosis present

## 2023-04-03 LAB — CBC
HCT: 38.2 % (ref 36.0–46.0)
HCT: 36 % (ref 36.0–46.0)
Hemoglobin: 13.1 g/dL (ref 12.0–15.0)
Hemoglobin: 12.3 g/dL (ref 12.0–15.0)
MCH: 29.5 pg (ref 26.0–34.0)
MCH: 29.5 pg (ref 26.0–34.0)
MCHC: 34.3 g/dL (ref 30.0–36.0)
MCHC: 34.2 g/dL (ref 30.0–36.0)
MCV: 86 fL (ref 80.0–100.0)
MCV: 86.3 fL (ref 80.0–100.0)
Platelets: 247 10*3/uL (ref 150–400)
Platelets: 254 10*3/uL (ref 150–400)
RBC: 4.44 MIL/uL (ref 3.87–5.11)
RBC: 4.17 MIL/uL (ref 3.87–5.11)
RDW: 12.5 % (ref 11.5–15.5)
RDW: 12.7 % (ref 11.5–15.5)
WBC: 14.5 10*3/uL — ABNORMAL HIGH (ref 4.0–10.5)
WBC: 12.1 10*3/uL — ABNORMAL HIGH (ref 4.0–10.5)
nRBC: 0 % (ref 0.0–0.2)
nRBC: 0 % (ref 0.0–0.2)

## 2023-04-03 LAB — CREATININE, SERUM
Creatinine, Ser: 3.07 mg/dL — ABNORMAL HIGH (ref 0.44–1.00)
GFR, Estimated: 18 mL/min — ABNORMAL LOW (ref >60)

## 2023-04-03 LAB — I-STAT CHEM 8, ED
BUN: 57 mg/dL — ABNORMAL HIGH (ref 6–20)
Calcium, Ion: 1.06 mmol/L — ABNORMAL LOW (ref 1.15–1.40)
Chloride: 101 mmol/L (ref 98–111)
Creatinine, Ser: 3.6 mg/dL — ABNORMAL HIGH (ref 0.44–1.00)
Glucose, Bld: 133 mg/dL — ABNORMAL HIGH (ref 70–99)
HCT: 40 % (ref 36.0–46.0)
Hemoglobin: 13.6 g/dL (ref 12.0–15.0)
Potassium: 4.2 mmol/L (ref 3.5–5.1)
Sodium: 129 mmol/L — ABNORMAL LOW (ref 135–145)
TCO2: 20 mmol/L — ABNORMAL LOW (ref 22–32)

## 2023-04-03 LAB — URINALYSIS, ROUTINE W REFLEX MICROSCOPIC
Bilirubin Urine: NEGATIVE
Glucose, UA: NEGATIVE mg/dL
Ketones, ur: NEGATIVE mg/dL
Leukocytes,Ua: NEGATIVE
Nitrite: NEGATIVE
Protein, ur: NEGATIVE mg/dL
Specific Gravity, Urine: 1.01 (ref 1.005–1.030)
pH: 6 (ref 5.0–8.0)

## 2023-04-03 LAB — COMPREHENSIVE METABOLIC PANEL
ALT: 633 U/L — ABNORMAL HIGH (ref 0–44)
AST: 1426 U/L — ABNORMAL HIGH (ref 15–41)
Albumin: 3.8 g/dL (ref 3.5–5.0)
Alkaline Phosphatase: 53 U/L (ref 38–126)
Anion gap: 11 (ref 5–15)
BUN: 67 mg/dL — ABNORMAL HIGH (ref 6–20)
CO2: 20 mmol/L — ABNORMAL LOW (ref 22–32)
Calcium: 8.5 mg/dL — ABNORMAL LOW (ref 8.9–10.3)
Chloride: 98 mmol/L (ref 98–111)
Creatinine, Ser: 3.05 mg/dL — ABNORMAL HIGH (ref 0.44–1.00)
GFR, Estimated: 19 mL/min — ABNORMAL LOW (ref >60)
Glucose, Bld: 136 mg/dL — ABNORMAL HIGH (ref 70–99)
Potassium: 4.4 mmol/L (ref 3.5–5.1)
Sodium: 129 mmol/L — ABNORMAL LOW (ref 135–145)
Total Bilirubin: 0.6 mg/dL (ref 0.0–1.2)
Total Protein: 6.1 g/dL — ABNORMAL LOW (ref 6.5–8.1)

## 2023-04-03 LAB — PROTIME-INR
INR: 1.2 (ref 0.8–1.2)
Prothrombin Time: 15.6 s — ABNORMAL HIGH (ref 11.4–15.2)

## 2023-04-03 LAB — HEPATITIS B SURFACE ANTIGEN: Hepatitis B Surface Ag: NONREACTIVE

## 2023-04-03 LAB — URINALYSIS, MICROSCOPIC (REFLEX): WBC, UA: NONE SEEN WBC/hpf (ref 0–5)

## 2023-04-03 LAB — CK: Total CK: 40547 U/L — ABNORMAL HIGH (ref 38–234)

## 2023-04-03 LAB — HCG, QUANTITATIVE, PREGNANCY: hCG, Beta Chain, Quant, S: <1 m[IU]/mL (ref <5)

## 2023-04-03 LAB — HIV ANTIBODY (ROUTINE TESTING W REFLEX): HIV Screen 4th Generation wRfx: NONREACTIVE

## 2023-04-03 LAB — APTT: aPTT: 27 s (ref 24–36)

## 2023-04-03 MED ORDER — FENTANYL CITRATE PF 50 MCG/ML IJ SOSY
50.0000 ug | PREFILLED_SYRINGE | Freq: Once | INTRAMUSCULAR | Status: AC
  Administered 2023-04-03: 50 ug via INTRAVENOUS
  Filled 2023-04-03: qty 1

## 2023-04-03 MED ORDER — ENOXAPARIN SODIUM 30 MG/0.3ML IJ SOSY
30.0000 mg | PREFILLED_SYRINGE | INTRAMUSCULAR | Status: DC
  Administered 2023-04-03 – 2023-04-05 (×3): 30 mg via SUBCUTANEOUS
  Filled 2023-04-03 (×3): qty 0.3

## 2023-04-03 MED ORDER — SODIUM CHLORIDE 0.9 % IV BOLUS
1000.0000 mL | Freq: Once | INTRAVENOUS | Status: AC
  Administered 2023-04-03: 1000 mL via INTRAVENOUS

## 2023-04-03 MED ORDER — OXYCODONE HCL 5 MG PO TABS
5.0000 mg | ORAL_TABLET | Freq: Four times a day (QID) | ORAL | Status: DC | PRN
  Administered 2023-04-03 – 2023-04-07 (×11): 5 mg via ORAL
  Filled 2023-04-03 (×12): qty 1

## 2023-04-03 MED ORDER — SODIUM CHLORIDE 0.9 % IV SOLN: INTRAVENOUS | Status: DC

## 2023-04-03 NOTE — Progress Notes (Signed)
 I have discussed the case with EDP.  I have reviewed the images of the right foot.  From ortho standpoint, plan for WBAT to RLE in a boot, for comfort. Follow up in 2 weeks with routine xrays.  Call with questions.  Yolonda Kida, MD [facility]
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
## 2023-04-03 NOTE — Progress Notes (Signed)
 Orthopedic Tech Progress Note Patient Details:  Alice Hernandez 1975/06/24 [id number]  Ortho Devices Type of Ortho Device: Postop shoe/boot, Velcro wrist splint Ortho Device/Splint Location: rle, rue Ortho Device/Splint Interventions: Ordered, Application, Adjustment   Post Interventions Patient Tolerated: Well Instructions Provided: Care of device, Adjustment of device  Trinna Post 04/03/2023, 10:44 PM

## 2023-04-04 ENCOUNTER — Observation Stay (HOSPITAL_COMMUNITY): Payer: Self-pay

## 2023-04-04 DIAGNOSIS — D72829 Elevated white blood cell count, unspecified: Secondary | ICD-10-CM | POA: Diagnosis present

## 2023-04-04 DIAGNOSIS — I10 Essential (primary) hypertension: Secondary | ICD-10-CM | POA: Diagnosis present

## 2023-04-04 DIAGNOSIS — Z59819 Housing instability, housed unspecified: Secondary | ICD-10-CM | POA: Diagnosis not present

## 2023-04-04 DIAGNOSIS — K5900 Constipation, unspecified: Secondary | ICD-10-CM | POA: Diagnosis present

## 2023-04-04 DIAGNOSIS — F419 Anxiety disorder, unspecified: Secondary | ICD-10-CM | POA: Diagnosis present

## 2023-04-04 DIAGNOSIS — E871 Hypo-osmolality and hyponatremia: Secondary | ICD-10-CM | POA: Diagnosis present

## 2023-04-04 DIAGNOSIS — E872 Acidosis, unspecified: Secondary | ICD-10-CM | POA: Diagnosis present

## 2023-04-04 DIAGNOSIS — F259 Schizoaffective disorder, unspecified: Secondary | ICD-10-CM | POA: Diagnosis present

## 2023-04-04 DIAGNOSIS — F4310 Post-traumatic stress disorder, unspecified: Secondary | ICD-10-CM | POA: Diagnosis present

## 2023-04-04 DIAGNOSIS — Y9259 Other trade areas as the place of occurrence of the external cause: Secondary | ICD-10-CM | POA: Diagnosis not present

## 2023-04-04 DIAGNOSIS — T796XXA Traumatic ischemia of muscle, initial encounter: Principal | ICD-10-CM

## 2023-04-04 DIAGNOSIS — S92354A Nondisplaced fracture of fifth metatarsal bone, right foot, initial encounter for closed fracture: Secondary | ICD-10-CM | POA: Diagnosis present

## 2023-04-04 DIAGNOSIS — R821 Myoglobinuria: Secondary | ICD-10-CM | POA: Diagnosis present

## 2023-04-04 DIAGNOSIS — S63501A Unspecified sprain of right wrist, initial encounter: Secondary | ICD-10-CM | POA: Diagnosis present

## 2023-04-04 DIAGNOSIS — N179 Acute kidney failure, unspecified: Secondary | ICD-10-CM

## 2023-04-04 DIAGNOSIS — H538 Other visual disturbances: Secondary | ICD-10-CM | POA: Diagnosis present

## 2023-04-04 DIAGNOSIS — R609 Edema, unspecified: Secondary | ICD-10-CM | POA: Diagnosis present

## 2023-04-04 DIAGNOSIS — S80811A Abrasion, right lower leg, initial encounter: Secondary | ICD-10-CM | POA: Diagnosis present

## 2023-04-04 DIAGNOSIS — Z973 Presence of spectacles and contact lenses: Secondary | ICD-10-CM | POA: Diagnosis not present

## 2023-04-04 DIAGNOSIS — S92811A Other fracture of right foot, initial encounter for closed fracture: Secondary | ICD-10-CM

## 2023-04-04 DIAGNOSIS — R1310 Dysphagia, unspecified: Secondary | ICD-10-CM | POA: Diagnosis present

## 2023-04-04 DIAGNOSIS — G8929 Other chronic pain: Secondary | ICD-10-CM | POA: Diagnosis present

## 2023-04-04 DIAGNOSIS — M6282 Rhabdomyolysis: Secondary | ICD-10-CM | POA: Diagnosis not present

## 2023-04-04 DIAGNOSIS — R748 Abnormal levels of other serum enzymes: Secondary | ICD-10-CM | POA: Diagnosis present

## 2023-04-04 DIAGNOSIS — J439 Emphysema, unspecified: Secondary | ICD-10-CM | POA: Diagnosis present

## 2023-04-04 DIAGNOSIS — M7989 Other specified soft tissue disorders: Secondary | ICD-10-CM | POA: Diagnosis present

## 2023-04-04 LAB — COMPREHENSIVE METABOLIC PANEL
ALT: 484 U/L — ABNORMAL HIGH (ref 0–44)
AST: 987 U/L — ABNORMAL HIGH (ref 15–41)
Albumin: 3.3 g/dL — ABNORMAL LOW (ref 3.5–5.0)
Alkaline Phosphatase: 42 U/L (ref 38–126)
Anion gap: 9 (ref 5–15)
BUN: 54 mg/dL — ABNORMAL HIGH (ref 6–20)
CO2: 17 mmol/L — ABNORMAL LOW (ref 22–32)
Calcium: 7.9 mg/dL — ABNORMAL LOW (ref 8.9–10.3)
Chloride: 111 mmol/L (ref 98–111)
Creatinine, Ser: 2.95 mg/dL — ABNORMAL HIGH (ref 0.44–1.00)
GFR, Estimated: 19 mL/min — ABNORMAL LOW (ref >60)
Glucose, Bld: 102 mg/dL — ABNORMAL HIGH (ref 70–99)
Potassium: 3.8 mmol/L (ref 3.5–5.1)
Sodium: 137 mmol/L (ref 135–145)
Total Bilirubin: 0.5 mg/dL (ref 0.0–1.2)
Total Protein: 6.1 g/dL — ABNORMAL LOW (ref 6.5–8.1)

## 2023-04-04 LAB — SALICYLATE LEVEL: Salicylate Lvl: <7 mg/dL — ABNORMAL LOW (ref 7.0–30.0)

## 2023-04-04 LAB — CK: Total CK: 30921 U/L — ABNORMAL HIGH (ref 38–234)

## 2023-04-04 LAB — GLUCOSE, CAPILLARY: Glucose-Capillary: 131 mg/dL — ABNORMAL HIGH (ref 70–99)

## 2023-04-04 MED ORDER — THIAMINE HCL 100 MG/ML IJ SOLN: 100.0000 mg | Freq: Every day | INTRAMUSCULAR | Status: DC

## 2023-04-04 MED ORDER — FOLIC ACID 1 MG PO TABS
1.0000 mg | ORAL_TABLET | Freq: Every day | ORAL | Status: DC
  Administered 2023-04-04 – 2023-04-07 (×2): 1 mg via ORAL
  Filled 2023-04-04 (×4): qty 1

## 2023-04-04 MED ORDER — THIAMINE MONONITRATE 100 MG PO TABS
100.0000 mg | ORAL_TABLET | Freq: Every day | ORAL | Status: DC
  Administered 2023-04-04: 100 mg via ORAL
  Filled 2023-04-04: qty 1

## 2023-04-04 MED ORDER — ADULT MULTIVITAMIN W/MINERALS CH
1.0000 | ORAL_TABLET | Freq: Every day | ORAL | Status: DC
  Administered 2023-04-05 – 2023-04-07 (×3): 1 via ORAL
  Filled 2023-04-04 (×3): qty 1

## 2023-04-04 MED ORDER — SODIUM CHLORIDE 0.9 % IV SOLN: INTRAVENOUS | Status: AC

## 2023-04-04 MED ORDER — THIAMINE MONONITRATE 100 MG PO TABS
100.0000 mg | ORAL_TABLET | Freq: Every day | ORAL | Status: DC
  Administered 2023-04-05 – 2023-04-07 (×3): 100 mg via ORAL
  Filled 2023-04-04 (×3): qty 1

## 2023-04-04 MED ORDER — OXYCODONE HCL 5 MG PO TABS
5.0000 mg | ORAL_TABLET | Freq: Once | ORAL | Status: AC
  Administered 2023-04-04: 5 mg via ORAL
  Filled 2023-04-04: qty 1

## 2023-04-04 MED ORDER — LORAZEPAM 2 MG/ML IJ SOLN
1.0000 mg | INTRAMUSCULAR | Status: AC | PRN
  Filled 2023-04-04: qty 1

## 2023-04-04 MED ORDER — SODIUM CHLORIDE 0.9 % IV BOLUS
1000.0000 mL | Freq: Once | INTRAVENOUS | Status: AC
Start: 2023-04-04 — End: 2023-04-04
  Administered 2023-04-04: 1000 mL via INTRAVENOUS

## 2023-04-04 MED ORDER — FOLIC ACID 1 MG PO TABS
1.0000 mg | ORAL_TABLET | Freq: Every day | ORAL | Status: DC
  Administered 2023-04-05 – 2023-04-06 (×2): 1 mg via ORAL
  Filled 2023-04-04 (×2): qty 1

## 2023-04-04 MED ORDER — LORAZEPAM 1 MG PO TABS
1.0000 mg | ORAL_TABLET | ORAL | Status: AC | PRN
  Administered 2023-04-04 – 2023-04-06 (×6): 1 mg via ORAL
  Administered 2023-04-07 (×2): 2 mg via ORAL
  Filled 2023-04-04 (×5): qty 1
  Filled 2023-04-04 (×2): qty 2
  Filled 2023-04-04: qty 1

## 2023-04-04 MED ORDER — HYDROXYZINE HCL 10 MG PO TABS
10.0000 mg | ORAL_TABLET | Freq: Once | ORAL | Status: AC
  Administered 2023-04-04: 10 mg via ORAL
  Filled 2023-04-04: qty 1

## 2023-04-04 MED ORDER — ADULT MULTIVITAMIN W/MINERALS CH
1.0000 | ORAL_TABLET | Freq: Every day | ORAL | Status: DC
  Administered 2023-04-04: 1 via ORAL
  Filled 2023-04-04: qty 1
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
## 2023-04-04 NOTE — Plan of Care (Signed)

## 2023-04-04 NOTE — Plan of Care (Signed)
  Problem: Education: Goal: Knowledge of General Education information will improve Description: Including pain rating scale, medication(s)/side effects and non-pharmacologic comfort measures Outcome: Progressing   Problem: Health Behavior/Discharge Planning: Goal: Ability to manage health-related needs will improve Outcome: Progressing   Problem: Clinical Measurements: Goal: Ability to maintain clinical measurements within normal limits will improve Outcome: Progressing   Problem: Clinical Measurements: Goal: Will remain free from infection Outcome: Progressing   Problem: Clinical Measurements: Goal: Diagnostic test results will improve Outcome: Progressing   Problem: Clinical Measurements: Goal: Cardiovascular complication will be avoided Outcome: Progressing   Problem: Activity: Goal: Risk for activity intolerance will decrease Outcome: Progressing   Problem: Nutrition: Goal: Adequate nutrition will be maintained Outcome: Progressing

## 2023-04-05 ENCOUNTER — Encounter (HOSPITAL_COMMUNITY): Payer: Self-pay | Admitting: Infectious Diseases

## 2023-04-05 ENCOUNTER — Inpatient Hospital Stay (HOSPITAL_COMMUNITY)

## 2023-04-05 DIAGNOSIS — N179 Acute kidney failure, unspecified: Secondary | ICD-10-CM

## 2023-04-05 DIAGNOSIS — M6282 Rhabdomyolysis: Secondary | ICD-10-CM | POA: Diagnosis not present

## 2023-04-05 DIAGNOSIS — M7989 Other specified soft tissue disorders: Secondary | ICD-10-CM

## 2023-04-05 DIAGNOSIS — I10 Essential (primary) hypertension: Secondary | ICD-10-CM

## 2023-04-05 LAB — COMPREHENSIVE METABOLIC PANEL
ALT: 326 U/L — ABNORMAL HIGH (ref 0–44)
AST: 699 U/L — ABNORMAL HIGH (ref 15–41)
Albumin: 2.8 g/dL — ABNORMAL LOW (ref 3.5–5.0)
Alkaline Phosphatase: 34 U/L — ABNORMAL LOW (ref 38–126)
Anion gap: 12 (ref 5–15)
BUN: 33 mg/dL — ABNORMAL HIGH (ref 6–20)
CO2: 17 mmol/L — ABNORMAL LOW (ref 22–32)
Calcium: 8.3 mg/dL — ABNORMAL LOW (ref 8.9–10.3)
Chloride: 108 mmol/L (ref 98–111)
Creatinine, Ser: 2 mg/dL — ABNORMAL HIGH (ref 0.44–1.00)
GFR, Estimated: 31 mL/min — ABNORMAL LOW (ref >60)
Glucose, Bld: 185 mg/dL — ABNORMAL HIGH (ref 70–99)
Potassium: 3.4 mmol/L — ABNORMAL LOW (ref 3.5–5.1)
Sodium: 137 mmol/L (ref 135–145)
Total Bilirubin: 0.4 mg/dL (ref 0.0–1.2)
Total Protein: 5.6 g/dL — ABNORMAL LOW (ref 6.5–8.1)

## 2023-04-05 LAB — CK: Total CK: 13504 U/L — ABNORMAL HIGH (ref 38–234)

## 2023-04-05 LAB — HEPATITIS B DNA, ULTRAQUANTITATIVE, PCR
HBV DNA SERPL PCR-ACNC: <10 [IU]/mL
HBV DNA SERPL PCR-LOG IU: UNDETERMINED {Log_IU}/mL

## 2023-04-05 LAB — GLUCOSE, CAPILLARY: Glucose-Capillary: 159 mg/dL — ABNORMAL HIGH (ref 70–99)

## 2023-04-05 LAB — ACETAMINOPHEN LEVEL: Acetaminophen (Tylenol), Serum: <10 ug/mL — ABNORMAL LOW (ref 10–30)

## 2023-04-05 MED ORDER — LACTATED RINGERS IV SOLN: INTRAVENOUS | Status: DC

## 2023-04-05 MED ORDER — SODIUM BICARBONATE 650 MG PO TABS
650.0000 mg | ORAL_TABLET | Freq: Two times a day (BID) | ORAL | Status: DC
Start: 2023-04-05 — End: 2023-04-06
  Administered 2023-04-05 – 2023-04-06 (×3): 650 mg via ORAL
  Filled 2023-04-05 (×3): qty 1

## 2023-04-05 MED ORDER — SODIUM CHLORIDE 0.9 % IV SOLN: INTRAVENOUS | Status: DC

## 2023-04-05 MED ORDER — LABETALOL HCL 5 MG/ML IV SOLN
5.0000 mg | INTRAVENOUS | Status: DC | PRN
  Administered 2023-04-05 (×3): 5 mg via INTRAVENOUS
  Filled 2023-04-05 (×3): qty 4

## 2023-04-05 MED ORDER — LACTATED RINGERS IV BOLUS: 1000.0000 mL | Freq: Once | INTRAVENOUS | Status: DC

## 2023-04-05 MED ORDER — POTASSIUM CHLORIDE 20 MEQ PO PACK
40.0000 meq | PACK | Freq: Once | ORAL | Status: AC
  Administered 2023-04-05: 40 meq via ORAL
  Filled 2023-04-05: qty 2

## 2023-04-05 MED ORDER — HYDRALAZINE HCL 20 MG/ML IJ SOLN
5.0000 mg | Freq: Four times a day (QID) | INTRAMUSCULAR | Status: DC | PRN
  Administered 2023-04-05 – 2023-04-06 (×2): 5 mg via INTRAVENOUS
  Filled 2023-04-05 (×2): qty 1

## 2023-04-05 MED ORDER — HYDROXYZINE HCL 25 MG PO TABS
25.0000 mg | ORAL_TABLET | Freq: Three times a day (TID) | ORAL | Status: DC | PRN
  Administered 2023-04-05 – 2023-04-07 (×2): 25 mg via ORAL
  Filled 2023-04-05 (×2): qty 1

## 2023-04-05 NOTE — Plan of Care (Signed)
  Problem: Education: Goal: Knowledge of General Education information will improve Description: Including pain rating scale, medication(s)/side effects and non-pharmacologic comfort measures Outcome: Progressing   Problem: Health Behavior/Discharge Planning: Goal: Ability to manage health-related needs will improve Outcome: Progressing   Problem: Clinical Measurements: Goal: Ability to maintain clinical measurements within normal limits will improve Outcome: Progressing Goal: Will remain free from infection Outcome: Progressing Goal: Diagnostic test results will improve Outcome: Progressing Goal: Respiratory complications will improve Outcome: Progressing Goal: Cardiovascular complication will be avoided Outcome: Progressing   Problem: Nutrition: Goal: Adequate nutrition will be maintained Outcome: Progressing   Problem: Coping: Goal: Level of anxiety will decrease Outcome: Progressing   Problem: Elimination: Goal: Will not experience complications related to urinary retention Outcome: Progressing   Problem: Pain Managment: Goal: General experience of comfort will improve and/or be controlled Outcome: Progressing   Problem: Safety: Goal: Ability to remain free from injury will improve Outcome: Progressing   Problem: Activity: Goal: Risk for activity intolerance will decrease Outcome: Not Progressing
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
## 2023-04-05 NOTE — TOC CM/SW Note (Addendum)
 Transition of Care [facility]) - Inpatient Brief Assessment   Patient Details  Name: Alice Hernandez MRN: [id number] Date of Birth: Oct 17, 1975  [facility]) CM/SW Contact:    Michaela Corner, LCSWA Phone Number: 04/05/2023, 3:59 PM   Clinical Narrative: CSW attempted to address substance use consult with patient 2x today, once in the morning and this afternoon. Patient asleep upon entry both times and did not wake to voice command. CSW will address substance use consult at a later time. SDOH resources placed on patients AVS.   TOC will continue to follow.     Transition of Care Asessment: Insurance and Status: Insurance coverage has been reviewed Patient has primary care physician: Yes Home environment has been reviewed: Homeless Prior level of function:: Independent Prior/Current Home Services: No current home services Social Drivers of Health Review: SDOH reviewed no interventions necessary Readmission risk has been reviewed: Yes Transition of care needs: no transition of care needs at this time\

## 2023-04-05 NOTE — Progress Notes (Signed)
 OT Cancellation Note  Patient Details Name: Daysia Vandenboom MRN: [id number] DOB: October 18, 1975   Cancelled Treatment:    Reason Eval/Treat Not Completed: Patient declined, no reason specified.  ATtempted to see pt. Pt refused stating she wasn't getting up to walk anywhere.  Attempted to have pt do therapy EOB and pt continued to decline.  Will attempt back as schedule allows.  Hope Budds 04/05/2023, 11:46 AM

## 2023-04-05 NOTE — Discharge Instructions (Signed)

## 2023-04-05 NOTE — Progress Notes (Signed)
 Helped patient to [facility] and noticed a smell in the room and patient was holding onto a vape. Patient notified that vapes/drugs are not allowed in the hospital. Charge nurse notified. Patient stated that it was her sons vape and that he left the hospital to use it throughout the day. Tried to confiscate but patient declined ever using it and said her son would keep it to himself. Patient notified that her blood pressure has been uncontrolled all day and that if she was using the vape this could be contributing to the high blood pressure. MD notified as well.

## 2023-04-06 DIAGNOSIS — M6282 Rhabdomyolysis: Secondary | ICD-10-CM | POA: Diagnosis not present

## 2023-04-06 DIAGNOSIS — N179 Acute kidney failure, unspecified: Secondary | ICD-10-CM | POA: Diagnosis not present

## 2023-04-06 LAB — COMPREHENSIVE METABOLIC PANEL
ALT: 274 U/L — ABNORMAL HIGH (ref 0–44)
AST: 549 U/L — ABNORMAL HIGH (ref 15–41)
Albumin: 3 g/dL — ABNORMAL LOW (ref 3.5–5.0)
Alkaline Phosphatase: 35 U/L — ABNORMAL LOW (ref 38–126)
Anion gap: 9 (ref 5–15)
BUN: 26 mg/dL — ABNORMAL HIGH (ref 6–20)
CO2: 25 mmol/L (ref 22–32)
Calcium: 9 mg/dL (ref 8.9–10.3)
Chloride: 106 mmol/L (ref 98–111)
Creatinine, Ser: 1.77 mg/dL — ABNORMAL HIGH (ref 0.44–1.00)
GFR, Estimated: 36 mL/min — ABNORMAL LOW (ref >60)
Glucose, Bld: 98 mg/dL (ref 70–99)
Potassium: 4.6 mmol/L (ref 3.5–5.1)
Sodium: 140 mmol/L (ref 135–145)
Total Bilirubin: 0.4 mg/dL (ref 0.0–1.2)
Total Protein: 6.1 g/dL — ABNORMAL LOW (ref 6.5–8.1)

## 2023-04-06 LAB — HCV INTERPRETATION

## 2023-04-06 LAB — GLUCOSE, CAPILLARY: Glucose-Capillary: 83 mg/dL (ref 70–99)

## 2023-04-06 LAB — CK
Total CK: 5309 U/L — ABNORMAL HIGH (ref 38–234)
Total CK: 5604 U/L — ABNORMAL HIGH (ref 38–234)

## 2023-04-06 LAB — HCV AB W REFLEX TO QUANT PCR: HCV Ab: NONREACTIVE

## 2023-04-06 LAB — HEPATITIS B CORE ANTIBODY, TOTAL: HEP B CORE AB: POSITIVE — AB

## 2023-04-06 MED ORDER — ENOXAPARIN SODIUM 40 MG/0.4ML IJ SOSY
40.0000 mg | PREFILLED_SYRINGE | INTRAMUSCULAR | Status: DC
Start: 2023-04-06 — End: 2023-04-07

## 2023-04-06 MED ORDER — AMLODIPINE BESYLATE 5 MG PO TABS
5.0000 mg | ORAL_TABLET | Freq: Every day | ORAL | Status: DC
  Administered 2023-04-06: 5 mg via ORAL
  Filled 2023-04-06: qty 1

## 2023-04-06 MED ORDER — POTASSIUM CHLORIDE 20 MEQ PO PACK
40.0000 meq | PACK | Freq: Once | ORAL | Status: AC
  Administered 2023-04-06: 40 meq via ORAL
  Filled 2023-04-06: qty 2

## 2023-04-06 NOTE — Progress Notes (Signed)
 Security confiscated 2 Vapes from the patient they have been placed in a bag with her name on it and security is taking it downstairs.

## 2023-04-06 NOTE — Progress Notes (Signed)
 OT Cancellation Note  Patient Details Name: Alice Hernandez MRN: [id number] DOB: [birth year]   Cancelled Treatment:    Reason Eval/Treat Not Completed: (P) Fatigue/lethargy limiting ability to participate, attempted to wake Pt up, able to after several attempts but Pt not able to maintain alert state, will check back as time allows   Alexis Goodell 04/06/2023, 3:27 PM

## 2023-04-06 NOTE — TOC Progression Note (Addendum)
 Transition of Care [facility]) - Progression Note    Patient Details  Name: Pamila Mendibles MRN: [id number] Date of Birth: 1975-08-15  [facility]) CM/SW Contact  Michaela Corner, [place] Phone Number: 04/06/2023, 3:18 PM  Clinical Narrative:   CSW went to patients bedside but upon entry patient was sleeping and did not wake to verbal command. CSW left substance use resources and shelter list at bedside. CSW added additional resources to patients AVS.  TOC will continue to follow.    Expected Discharge Plan:  (TBD) Barriers to Discharge: Continued Medical Work up  Expected Discharge Plan and Services In-house Referral: Clinical Social Work     Living arrangements for the past 2 months: Homeless                                       Social Determinants of Health (SDOH) Interventions SDOH Screenings   Food Insecurity: Food Insecurity Present (04/03/2023)  Housing: High Risk (04/03/2023)  Transportation Needs: Unmet Transportation Needs (04/03/2023)  Utilities: Not At Risk (04/03/2023)  Social Connections: Socially Isolated (04/03/2023)  Tobacco Use: High Risk (04/05/2023)    Readmission Risk Interventions     No data to display
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
## 2023-04-07 ENCOUNTER — Inpatient Hospital Stay (HOSPITAL_COMMUNITY): Payer: Self-pay

## 2023-04-07 ENCOUNTER — Other Ambulatory Visit (HOSPITAL_COMMUNITY): Payer: Self-pay

## 2023-04-07 DIAGNOSIS — N179 Acute kidney failure, unspecified: Secondary | ICD-10-CM | POA: Diagnosis not present

## 2023-04-07 LAB — COMPREHENSIVE METABOLIC PANEL
ALT: 228 U/L — ABNORMAL HIGH (ref 0–44)
AST: 413 U/L — ABNORMAL HIGH (ref 15–41)
Albumin: 3.2 g/dL — ABNORMAL LOW (ref 3.5–5.0)
Alkaline Phosphatase: 36 U/L — ABNORMAL LOW (ref 38–126)
Anion gap: 12 (ref 5–15)
BUN: 23 mg/dL — ABNORMAL HIGH (ref 6–20)
CO2: 25 mmol/L (ref 22–32)
Calcium: 9.5 mg/dL (ref 8.9–10.3)
Chloride: 101 mmol/L (ref 98–111)
Creatinine, Ser: 1.37 mg/dL — ABNORMAL HIGH (ref 0.44–1.00)
GFR, Estimated: 49 mL/min — ABNORMAL LOW (ref >60)
Glucose, Bld: 107 mg/dL — ABNORMAL HIGH (ref 70–99)
Potassium: 4.3 mmol/L (ref 3.5–5.1)
Sodium: 138 mmol/L (ref 135–145)
Total Bilirubin: 0.4 mg/dL (ref 0.0–1.2)
Total Protein: 6.2 g/dL — ABNORMAL LOW (ref 6.5–8.1)

## 2023-04-07 LAB — CK
Total CK: 2450 U/L — ABNORMAL HIGH (ref 38–234)
Total CK: 4042 U/L — ABNORMAL HIGH (ref 38–234)

## 2023-04-07 LAB — GLUCOSE, CAPILLARY: Glucose-Capillary: 104 mg/dL — ABNORMAL HIGH (ref 70–99)

## 2023-04-07 MED ORDER — FOLIC ACID 1 MG PO TABS
1.0000 mg | ORAL_TABLET | Freq: Every day | ORAL | 1 refills | Status: AC
  Filled 2023-04-07: qty 90, 90d supply, fill #0

## 2023-04-07 MED ORDER — ACETAMINOPHEN 500 MG PO TABS
1000.0000 mg | ORAL_TABLET | Freq: Three times a day (TID) | ORAL | 0 refills | Status: AC | PRN
  Filled 2023-04-07: qty 30, 5d supply, fill #0

## 2023-04-07 MED ORDER — AMLODIPINE BESYLATE 10 MG PO TABS
10.0000 mg | ORAL_TABLET | Freq: Every day | ORAL | 1 refills | Status: AC
  Filled 2023-04-07: qty 90, 90d supply, fill #0

## 2023-04-07 MED ORDER — AMLODIPINE BESYLATE 10 MG PO TABS
10.0000 mg | ORAL_TABLET | Freq: Every day | ORAL | Status: DC
  Administered 2023-04-07: 10 mg via ORAL
  Filled 2023-04-07: qty 2

## 2023-04-07 MED ORDER — ADULT MULTIVITAMIN W/MINERALS CH
1.0000 | ORAL_TABLET | Freq: Every day | ORAL | 1 refills | Status: AC
  Filled 2023-04-07: qty 90, 90d supply, fill #0

## 2023-04-07 MED ORDER — THIAMINE HCL 100 MG PO TABS
100.0000 mg | ORAL_TABLET | Freq: Every day | ORAL | 1 refills | Status: AC
  Filled 2023-04-07: qty 90, 90d supply, fill #0
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
## 2023-04-07 NOTE — Progress Notes (Signed)
 Caesar Bookman to be D/C'd  per MD order.  Discussed with the patient and all questions fully answered.  VSS, Skin clean, dry and intact without evidence of skin break down, no evidence of skin tears noted.  IV catheter discontinued intact. Site without signs and symptoms of complications. Dressing and pressure applied.  An After Visit Summary was printed and given to the patient. Patient instructed to pick up meds from TOC.   D/c education completed with patient/family including follow up instructions, medication list, d/c activities limitations if indicated, with other d/c instructions as indicated by MD - patient able to verbalize understanding, all questions fully answered.   Patient instructed to return to ED, call 911, or call MD for any changes in condition.   Patient to be escorted via WC by volunteer. Pt is homeless.

## 2023-04-07 NOTE — Plan of Care (Signed)
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
## 2023-04-07 NOTE — Progress Notes (Signed)
 Unit director, Ulyess Mort, RN notified me that pt was attempting to leaving without d'c orders. I was able to convince pt to come back to her room to notified Dr. Modena Slater to verify if she would be d'c today. Dr. Allena Katz said they were waiting for the results of the XR of right arm and pt could be d'c.

## 2023-04-15 ENCOUNTER — Encounter: Admitting: Student
